# Patient Record
Sex: Male | Born: 1935 | Race: White | Hispanic: No | Marital: Married | Smoking: Never smoker
Health system: Southern US, Community
[De-identification: ages and names within clinical notes are randomized; demographics above are authoritative.]

## PROBLEM LIST (undated history)

## (undated) DIAGNOSIS — Z803 Family history of malignant neoplasm of breast: Secondary | ICD-10-CM

## (undated) DIAGNOSIS — Z8669 Personal history of other diseases of the nervous system and sense organs: Secondary | ICD-10-CM

## (undated) DIAGNOSIS — K635 Polyp of colon: Secondary | ICD-10-CM

## (undated) DIAGNOSIS — I10 Essential (primary) hypertension: Secondary | ICD-10-CM

## (undated) DIAGNOSIS — R0600 Dyspnea, unspecified: Secondary | ICD-10-CM

## (undated) DIAGNOSIS — Z800 Family history of malignant neoplasm of digestive organs: Secondary | ICD-10-CM

## (undated) DIAGNOSIS — E7800 Pure hypercholesterolemia, unspecified: Secondary | ICD-10-CM

## (undated) DIAGNOSIS — D649 Anemia, unspecified: Secondary | ICD-10-CM

## (undated) DIAGNOSIS — Z86010 Personal history of colonic polyps: Secondary | ICD-10-CM

## (undated) DIAGNOSIS — K5790 Diverticulosis of intestine, part unspecified, without perforation or abscess without bleeding: Secondary | ICD-10-CM

## (undated) DIAGNOSIS — I2510 Atherosclerotic heart disease of native coronary artery without angina pectoris: Secondary | ICD-10-CM

## (undated) DIAGNOSIS — N189 Chronic kidney disease, unspecified: Secondary | ICD-10-CM

## (undated) DIAGNOSIS — C801 Malignant (primary) neoplasm, unspecified: Secondary | ICD-10-CM

## (undated) DIAGNOSIS — C189 Malignant neoplasm of colon, unspecified: Secondary | ICD-10-CM

## (undated) DIAGNOSIS — H353190 Nonexudative age-related macular degeneration, unspecified eye, stage unspecified: Secondary | ICD-10-CM

## (undated) DIAGNOSIS — Z5189 Encounter for other specified aftercare: Secondary | ICD-10-CM

## (undated) HISTORY — DX: Family history of malignant neoplasm of digestive organs: Z80.0

## (undated) HISTORY — DX: Chronic kidney disease, unspecified: N18.9

## (undated) HISTORY — DX: Polyp of colon: K63.5

## (undated) HISTORY — DX: Pure hypercholesterolemia, unspecified: E78.00

## (undated) HISTORY — DX: Diverticulosis of intestine, part unspecified, without perforation or abscess without bleeding: K57.90

## (undated) HISTORY — PX: APPENDECTOMY: SHX54

## (undated) HISTORY — PX: NECK SURGERY: SHX720

## (undated) HISTORY — DX: Atherosclerotic heart disease of native coronary artery without angina pectoris: I25.10

## (undated) HISTORY — DX: Essential (primary) hypertension: I10

## (undated) HISTORY — PX: COLON SURGERY: SHX602

## (undated) HISTORY — PX: TONSILLECTOMY: SUR1361

## (undated) HISTORY — DX: Family history of malignant neoplasm of breast: Z80.3

## (undated) HISTORY — DX: Personal history of colonic polyps: Z86.010

## (undated) HISTORY — PX: CATARACT EXTRACTION, BILATERAL: SHX1313

## (undated) HISTORY — DX: Anemia, unspecified: D64.9

## (undated) HISTORY — PX: UPPER GASTROINTESTINAL ENDOSCOPY: SHX188

## (undated) HISTORY — PX: CHOLECYSTECTOMY: SHX55

## (undated) HISTORY — DX: Encounter for other specified aftercare: Z51.89

---

## 1997-09-02 HISTORY — PX: OTHER SURGICAL HISTORY: SHX169

## 1997-09-02 HISTORY — PX: ANGIOPLASTY: SHX39

## 2004-03-02 DIAGNOSIS — K635 Polyp of colon: Secondary | ICD-10-CM

## 2004-03-02 HISTORY — DX: Polyp of colon: K63.5

## 2007-07-21 HISTORY — PX: COLONOSCOPY: SHX174

## 2009-07-06 HISTORY — PX: SIGMOIDOSCOPY: SUR1295

## 2012-02-07 HISTORY — PX: ESOPHAGOGASTRODUODENOSCOPY: SHX1529

## 2018-07-17 ENCOUNTER — Encounter: Payer: Self-pay | Admitting: Gastroenterology

## 2018-07-28 ENCOUNTER — Ambulatory Visit (INDEPENDENT_AMBULATORY_CARE_PROVIDER_SITE_OTHER): Payer: Medicare Other | Admitting: Gastroenterology

## 2018-07-28 ENCOUNTER — Encounter: Payer: Self-pay | Admitting: Gastroenterology

## 2018-07-28 VITALS — BP 138/62 | HR 60 | Ht 69.0 in | Wt 179.5 lb

## 2018-07-28 DIAGNOSIS — D638 Anemia in other chronic diseases classified elsewhere: Secondary | ICD-10-CM | POA: Diagnosis not present

## 2018-07-28 DIAGNOSIS — R195 Other fecal abnormalities: Secondary | ICD-10-CM | POA: Diagnosis not present

## 2018-07-28 NOTE — Patient Instructions (Signed)
If you are age 82 or older, your body mass index should be between 23-30. Your Body mass index is 26.51 kg/m. If this is out of the aforementioned range listed, please consider follow up with your Primary Care Provider.  If you are age 46 or younger, your body mass index should be between 19-25. Your Body mass index is 26.51 kg/m. If this is out of the aformentioned range listed, please consider follow up with your Primary Care Provider.   You have been scheduled for a colonoscopy. Please follow written instructions given to you at your visit today.  Please pick up your prep supplies at the pharmacy within the next 1-3 days. If you use inhalers (even only as needed), please bring them with you on the day of your procedure. Your physician has requested that you go to www.startemmi.com and enter the access code given to you at your visit today. This web site gives a general overview about your procedure. However, you should still follow specific instructions given to you by our office regarding your preparation for the procedure.  Thank you,  Dr. Jackquline Denmark

## 2018-07-28 NOTE — Progress Notes (Signed)
Chief Complaint: heme positive stools  Referring Provider:  Dr Pearson Grippe      ASSESSMENT AND PLAN;   #1. Heme positive stools. Pt with history of colonic polyps 2005/2008.  #2. Anemia of chronic disease with CRI (Cr 2.0 per pts wife). 10/21 Hb 10.9 11/4 11.5, 2 years ago was 13.  Plan: - Please obtain previous recent blood work from Dr. Adin Hector office. - Proceed with colonoscopy with MiraLAX prep.  I have discussed the risks and benefits.  The risks including risk of perforation requiring laparotomy, bleeding after polypectomy requiring blood transfusions and risks of anesthesia/sedation were discussed.  Rare risks of missing colorectal neoplasms were also discussed. Consent forms were given for review. - Discussed above in detail with the patient and patient's wife.   HPI:    Bradley [name] is a 84 y.o. male  With heme positive stools  Anemia of chronic disease with hemoglobin of 10.9, creatinine 2.0 per patient's wife October 2019.  We do not have the results.  No blood work in care everywhere. No nausea, vomiting, heartburn, regurgitation, odynophagia or dysphagia.  No significant diarrhea or constipation.  There is no melena or hematochezia. No unintentional weight loss. Occasional diarrhea after drinking milk/having cereal with milk.   Past GI history: -Colonoscopy 03/29/2004-polyps, diverticulosis.  Repeat colonoscopy 07/03/2005-adenomatous polyps, repeat colonoscopy 07/21/2007-adenomatous polyps, diverticulosis.  Had sigmoidoscopy 07/06/2009-diverticulosis. -EGD 02/07/2012-mild gastritis. Entire GI work-up was performed in [place] by Dr. Huel Cote.   Past Medical History:  Diagnosis Date  . Anemia   . CAD (coronary artery disease)   . Chronic kidney disease   . Colon polyps 03/2004  . Diverticulosis   . Elevated cholesterol   . High blood pressure     Past Surgical History:  Procedure Laterality Date  . ANGIOPLASTY  1999  . APPENDECTOMY    .  CHOLECYSTECTOMY    . COLONOSCOPY  07/21/2007   adenomatous polyps diverticulosis  . ESOPHAGOGASTRODUODENOSCOPY  02/07/2012   mild gastritis   . SIGMOIDOSCOPY  07/06/2009   diverticulosis    Family History  Problem Relation Age of Onset  . Esophageal cancer Father   . High blood pressure Mother   . Kidney failure Mother   . High blood pressure Sister   . Colon cancer Neg Hx     Social History   Tobacco Use  . Smoking status: Never Smoker  . Smokeless tobacco: Never Used  Substance Use Topics  . Alcohol use: Yes    Comment: wine 2 times a month  . Drug use: Never    Current Outpatient Medications  Medication Sig Dispense Refill  . aspirin EC 81 MG tablet Take by mouth daily.    Marland Kitchen atenolol (TENORMIN) 50 MG tablet Take 25 mg by mouth daily.  1  . atorvastatin (LIPITOR) 40 MG tablet Take by mouth daily.    . Lutein 40 MG CAPS Take by mouth daily.    . Multiple Vitamins-Minerals (PRESERVISION AREDS PO) Take 1 tablet by mouth 2 (two) times daily.     No current facility-administered medications for this visit.     Not on File  Review of Systems:  Constitutional: Denies fever, chills, diaphoresis, appetite change and fatigue.  HEENT: Denies photophobia, eye pain, redness, hearing loss, ear pain, congestion, sore throat, rhinorrhea, sneezing, mouth sores, neck pain, neck stiffness and tinnitus.   Respiratory: Denies SOB, DOE, cough, chest tightness,  and wheezing.   Cardiovascular: Denies chest pain, palpitations and leg swelling.  Genitourinary: Denies  dysuria, urgency, frequency, hematuria, flank pain and difficulty urinating.  Musculoskeletal: Denies myalgias, back pain, joint swelling, arthralgias and gait problem.  Skin: No rash.  Neurological: Denies dizziness, seizures, syncope, weakness, light-headedness, numbness and headaches.  Hematological: Denies adenopathy. Easy bruising, personal or family bleeding history  Psychiatric/Behavioral: No anxiety or depression      Physical Exam:    BP 138/62   Pulse 60   Ht 5\' 9"  (1.753 m)   Wt 179 lb 8 oz (81.4 kg)   BMI 26.51 kg/m  Filed Weights   07/28/18 0833  Weight: 179 lb 8 oz (81.4 kg)   Constitutional:  Well-developed, in no acute distress. Psychiatric: Normal mood and affect. Behavior is normal. HEENT: Pupils normal.  Conjunctivae are normal. No scleral icterus. Neck supple.  Cardiovascular: Normal rate, regular rhythm. No edema Pulmonary/chest: Effort normal and breath sounds normal. No wheezing, rales or rhonchi. Abdominal: Soft, nondistended. Nontender. Bowel sounds active throughout. There are no masses palpable. No hepatomegaly. Rectal:  defered Neurological: Alert and oriented to person place and time. Skin: Skin is warm and dry. No rashes noted.    Carmell Austria, MD 07/28/2018, 8:50 AM  Cc: Dr. Pearson Grippe.

## 2018-08-04 ENCOUNTER — Ambulatory Visit (AMBULATORY_SURGERY_CENTER): Payer: Medicare Other | Admitting: Gastroenterology

## 2018-08-04 ENCOUNTER — Encounter: Payer: Self-pay | Admitting: Gastroenterology

## 2018-08-04 VITALS — BP 157/74 | HR 54 | Temp 97.3°F | Resp 14 | Ht 69.0 in | Wt 179.0 lb

## 2018-08-04 DIAGNOSIS — R195 Other fecal abnormalities: Secondary | ICD-10-CM

## 2018-08-04 DIAGNOSIS — K6389 Other specified diseases of intestine: Secondary | ICD-10-CM

## 2018-08-04 DIAGNOSIS — C180 Malignant neoplasm of cecum: Secondary | ICD-10-CM | POA: Diagnosis present

## 2018-08-04 DIAGNOSIS — D125 Benign neoplasm of sigmoid colon: Secondary | ICD-10-CM

## 2018-08-04 DIAGNOSIS — D123 Benign neoplasm of transverse colon: Secondary | ICD-10-CM

## 2018-08-04 DIAGNOSIS — K635 Polyp of colon: Secondary | ICD-10-CM

## 2018-08-04 MED ORDER — SODIUM CHLORIDE 0.9 % IV SOLN: 500.0000 mL | Freq: Once | INTRAVENOUS | Status: DC

## 2018-08-04 NOTE — Progress Notes (Signed)
All specimens sent [facility].called [facility] at 1445.

## 2018-08-04 NOTE — Progress Notes (Signed)
Called to room to assist during endoscopic procedure.  Patient ID and intended procedure confirmed with present staff. Received instructions for my participation in the procedure from the performing physician.  

## 2018-08-04 NOTE — Progress Notes (Signed)
Report to RN, VSS, adequate respirations noted, no c/o pain or discomfort 

## 2018-08-04 NOTE — Patient Instructions (Signed)
Information on polyps,diverticulosis,& hemorrhoids given to you today  Await for pathology results from biopsies from Cecal mass and on polyps removed   Dr Lyndel Safe will order labs once pathology back if needed    YOU HAD AN ENDOSCOPIC PROCEDURE TODAY AT [facility]:   Refer to the procedure report that was given to you for any specific questions about what was found during the examination.  If the procedure report does not answer your questions, please call your gastroenterologist to clarify.  If you requested that your care partner not be given the details of your procedure findings, then the procedure report has been included in a sealed envelope for you to review at your convenience later.  YOU SHOULD EXPECT: Some feelings of bloating in the abdomen. Passage of more gas than usual.  Walking can help get rid of the air that was put into your GI tract during the procedure and reduce the bloating. If you had a lower endoscopy (such as a colonoscopy or flexible sigmoidoscopy) you may notice spotting of blood in your stool or on the toilet paper. If you underwent a bowel prep for your procedure, you may not have a normal bowel movement for a few days.  Please Note:  You might notice some irritation and congestion in your nose or some drainage.  This is from the oxygen used during your procedure.  There is no need for concern and it should clear up in a day or so.  SYMPTOMS TO REPORT IMMEDIATELY:   Following lower endoscopy (colonoscopy or flexible sigmoidoscopy):  Excessive amounts of blood in the stool  Significant tenderness or worsening of abdominal pains  Swelling of the abdomen that is new, acute  Fever of 100F or higher   Following upper endoscopy (EGD)  Vomiting of blood or coffee ground material  New chest pain or pain under the shoulder blades  Painful or persistently difficult swallowing  New shortness of breath  Fever of 100F or higher  Black, tarry-looking  stools  For urgent or emergent issues, a gastroenterologist can be reached at any hour by calling [phone].   DIET:  We do recommend a small meal at first, but then you may proceed to your regular diet.  Drink plenty of fluids but you should avoid alcoholic beverages for 24 hours.  ACTIVITY:  You should plan to take it easy for the rest of today and you should NOT DRIVE or use heavy machinery until tomorrow (because of the sedation medicines used during the test).    FOLLOW UP: Our staff will call the number listed on your records the next business day following your procedure to check on you and address any questions or concerns that you may have regarding the information given to you following your procedure. If we do not reach you, we will leave a message.  However, if you are feeling well and you are not experiencing any problems, there is no need to return our call.  We will assume that you have returned to your regular daily activities without incident.  If any biopsies were taken you will be contacted by phone or by letter within the next 1-3 weeks.  Please call us at [phone] if you have not heard about the biopsies in 3 weeks.    SIGNATURES/CONFIDENTIALITY: You and/or your care partner have signed paperwork which will be entered into your electronic medical record.  These signatures attest to the fact that that the information above on  your After Visit Summary has been reviewed and is understood.  Full responsibility of the confidentiality of this discharge information lies with you and/or your care-partner.

## 2018-08-05 ENCOUNTER — Telehealth: Payer: Self-pay | Admitting: *Deleted

## 2018-08-05 NOTE — Telephone Encounter (Signed)
  Follow up Call-  Call back number 08/04/2018  Post procedure Call Back phone  # [phone]  Permission to leave phone message Yes  Some recent data might be hidden     Patient questions:  Do you have a fever, pain , or abdominal swelling? No. Pain Score  0 *  Have you tolerated food without any problems? Yes.    Have you been able to return to your normal activities? Yes.    Do you have any questions about your discharge instructions: Diet   No. Medications  No. Follow up visit  No.  Do you have questions or concerns about your Care? No.  Actions: * If pain score is 4 or above: No action needed, pain <4.

## 2018-08-06 ENCOUNTER — Encounter: Payer: Self-pay | Admitting: Gastroenterology

## 2018-08-06 ENCOUNTER — Other Ambulatory Visit: Payer: Self-pay

## 2018-08-06 DIAGNOSIS — C180 Malignant neoplasm of cecum: Secondary | ICD-10-CM

## 2018-08-06 DIAGNOSIS — C801 Malignant (primary) neoplasm, unspecified: Secondary | ICD-10-CM

## 2018-08-06 DIAGNOSIS — R195 Other fecal abnormalities: Secondary | ICD-10-CM

## 2018-08-06 DIAGNOSIS — K6389 Other specified diseases of intestine: Secondary | ICD-10-CM

## 2018-08-06 NOTE — Op Note (Signed)
[facility] Patient Name: Bradley [name] Procedure Date: 08/04/2018 2:08 PM MRN: [id number] Endoscopist: Jackquline Denmark , MD Age: 84 Referring MD:  Date of Birth: [birth year] Gender: Male Account #: [id number] Procedure:                Colonoscopy Indications:              Heme positive stool, Unexplained iron deficiency                            anemia Medicines:                Monitored Anesthesia Care Procedure:                Pre-Anesthesia Assessment:                           - Prior to the procedure, a History and Physical                            was performed, and patient medications and                            allergies were reviewed. The patient's tolerance of                            previous anesthesia was also reviewed. The risks                            and benefits of the procedure and the sedation                            options and risks were discussed with the patient.                            All questions were answered, and informed consent                            was obtained. Prior Anticoagulants: The patient has                            taken no previous anticoagulant or antiplatelet                            agents. ASA Grade Assessment: III - A patient with                            severe systemic disease. After reviewing the risks                            and benefits, the patient was deemed in                            satisfactory condition to undergo the procedure.  After obtaining informed consent, the colonoscope                            was passed under direct vision. Throughout the                            procedure, the patient's blood pressure, pulse, and                            oxygen saturations were monitored continuously. The                            Colonoscope was introduced through the anus and                            advanced to the the cecum, identified by the                             ileocecal valve. The colonoscopy was performed                            without difficulty. The patient tolerated the                            procedure well. The quality of the bowel                            preparation was good. Scope In: 2:11:25 PM Scope Out: 2:39:16 PM Scope Withdrawal Time: 0 hours 21 minutes 56 seconds  Total Procedure Duration: 0 hours 27 minutes 51 seconds  Findings:                 A fungating and ulcerated non-obstructing large                            mass was found in the cecum almost completely                            filling the cecum. The mass was circumferential and                            friable. This was biopsied with a cold forceps for                            histology. Area just distal to the mass was                            tattooed with an injection of 3 mL of [place] ink.                           Four sessile polyps were found in the transverse                            colon. The polyps  were 6 to 8 mm in size. Three of                            the larger polyps were removed with a hot snare,                            smaller with cold snare. Resection and retrieval                            were complete. Estimated blood loss: none.                           A 6 mm polyp was found in the proximal sigmoid                            colon. The polyp was sessile. The polyp was removed                            with a cold snare. Resection and retrieval were                            complete. Estimated blood loss: none.                           Multiple small and large-mouthed diverticula were                            found in the sigmoid colon and proximal descending                            colon.                           Non-bleeding internal hemorrhoids were found during                            retroflexion. The hemorrhoids were small. Complications:            No immediate  complications. Estimated Blood Loss:     Estimated blood loss: none. Impression:               -Cecal mass (Biopsied. Tattooed)                           -Colonic polyps status post polypectomy.                           -Moderate to severe predominantly sigmoid                            diverticulosis. Recommendation:           - Patient has a contact number available for                            emergencies. The signs and symptoms of potential  delayed complications were discussed with the                            patient. Return to normal activities tomorrow.                            Written discharge instructions were provided to the                            patient.                           - Resume previous diet.                           - Continue present medications.                           - Await pathology results.                           - Once the biopsies are back, check CBC, CMP and a                            CEA level. Thereafter, refer to a surgery and                            oncology. Jackquline Denmark, MD 08/04/2018 2:52:09 PM This report has been signed electronically.

## 2018-08-06 NOTE — Progress Notes (Signed)
Called patient's wife-informed lab work has been ordered for [facility] @ [facility] on 08/07/18 arrival at 1:15 pm for appt at 1:30 pm; patient's wife Regino Schultze) verbalized understanding and will call back if questions/concerns arise;  MD please advise -[facility] as the referral location?

## 2018-08-06 NOTE — Progress Notes (Signed)
Have discussed the biopsy results with the patient's wife over the phone. Her phone number is [phone].  Plan: -Check CBC, CMP, CEA level.  Patient's wife would like to get it done tomorrow afternoon at [facility]  -Needs CT scan of the abdomen and pelvis with p.o. and preferably IV contrast. (Of note the previous creatinine was elevated.  We may have to run it by Dr. Joelyn Oms nephrology once the blood work is back) -Needs surgical consultation for possible right hemicolectomy and oncology consultation. -Please call patient's wife at the above phone number.

## 2018-08-07 ENCOUNTER — Other Ambulatory Visit (INDEPENDENT_AMBULATORY_CARE_PROVIDER_SITE_OTHER): Payer: Medicare Other

## 2018-08-07 DIAGNOSIS — C180 Malignant neoplasm of cecum: Secondary | ICD-10-CM | POA: Diagnosis not present

## 2018-08-07 DIAGNOSIS — R195 Other fecal abnormalities: Secondary | ICD-10-CM

## 2018-08-07 DIAGNOSIS — K6389 Other specified diseases of intestine: Secondary | ICD-10-CM

## 2018-08-07 DIAGNOSIS — C801 Malignant (primary) neoplasm, unspecified: Secondary | ICD-10-CM

## 2018-08-07 LAB — CBC WITH DIFFERENTIAL/PLATELET
Basophils Absolute: 0 10*3/uL (ref 0.0–0.1)
Basophils Relative: 0.6 % (ref 0.0–3.0)
Eosinophils Absolute: 0.3 10*3/uL (ref 0.0–0.7)
Eosinophils Relative: 4.7 % (ref 0.0–5.0)
HCT: 35.4 % — ABNORMAL LOW (ref 39.0–52.0)
HEMOGLOBIN: 11.3 g/dL — AB (ref 13.0–17.0)
Lymphocytes Relative: 21.3 % (ref 12.0–46.0)
Lymphs Abs: 1.4 10*3/uL (ref 0.7–4.0)
MCHC: 32 g/dL (ref 30.0–36.0)
MCV: 82.3 fl (ref 78.0–100.0)
Monocytes Absolute: 0.5 10*3/uL (ref 0.1–1.0)
Monocytes Relative: 7.8 % (ref 3.0–12.0)
Neutro Abs: 4.2 10*3/uL (ref 1.4–7.7)
Neutrophils Relative %: 65.6 % (ref 43.0–77.0)
Platelets: 211 10*3/uL (ref 150.0–400.0)
RBC: 4.3 Mil/uL (ref 4.22–5.81)
RDW: 16.5 % — ABNORMAL HIGH (ref 11.5–15.5)
WBC: 6.4 10*3/uL (ref 4.0–10.5)

## 2018-08-07 LAB — COMPREHENSIVE METABOLIC PANEL
ALT: 9 U/L (ref 0–53)
AST: 15 U/L (ref 0–37)
Albumin: 3.8 g/dL (ref 3.5–5.2)
Alkaline Phosphatase: 64 U/L (ref 39–117)
BILIRUBIN TOTAL: 0.4 mg/dL (ref 0.2–1.2)
BUN: 33 mg/dL — ABNORMAL HIGH (ref 6–23)
CO2: 25 mEq/L (ref 19–32)
Calcium: 8.5 mg/dL (ref 8.4–10.5)
Chloride: 108 mEq/L (ref 96–112)
Creatinine, Ser: 2.11 mg/dL — ABNORMAL HIGH (ref 0.40–1.50)
GFR: 31.89 mL/min — ABNORMAL LOW (ref >60.00)
Glucose, Bld: 163 mg/dL — ABNORMAL HIGH (ref 70–99)
Potassium: 4.4 mEq/L (ref 3.5–5.1)
Sodium: 141 mEq/L (ref 135–145)
Total Protein: 6 g/dL (ref 6.0–8.3)

## 2018-08-07 NOTE — Progress Notes (Signed)
Lets do [facility] as surgical ref

## 2018-08-07 NOTE — Progress Notes (Signed)
Surgical referral faxed to [facility] for possible right hemicolectomy-patient is scheduled on 08/31/18 at 11:30 am; amb ref to oncology order placed;  Is the next step in the plan of care for this patient a CT abd/pelvis with ORAL contrast only (Dr. Joelyn Oms?) or please advise on next step for this patient's plan of care; Thank you

## 2018-08-10 LAB — CEA: CEA: 2.3 ng/mL

## 2018-08-11 ENCOUNTER — Other Ambulatory Visit: Payer: Self-pay

## 2018-08-11 DIAGNOSIS — K6389 Other specified diseases of intestine: Secondary | ICD-10-CM

## 2018-08-11 DIAGNOSIS — R195 Other fecal abnormalities: Secondary | ICD-10-CM

## 2018-08-11 NOTE — Progress Notes (Signed)
CT abd/pelvis ordered for patient;

## 2018-08-11 NOTE — Progress Notes (Signed)
Attempted to reach patient -unable to leave message-will try to reach patient at a later date/time;

## 2018-08-11 NOTE — Progress Notes (Signed)
Lets get CT scan abdo/pelvis with PO (no IV) contrast. Set him with Oncology co-ordinator

## 2018-08-12 NOTE — Progress Notes (Signed)
Please see additional documentation concerning CT abd/pelvis already being ordered for the patient on 08/14/18 with [place] CT-arrival at 4:00pm, 1st bottle of contrast at 2:15 pm and the 2nd bottle at 3:15 pm; patient aware to obtain contrast from [place] CT and also obtain the instructions for scan; patient's wife verbalized understanding of information/instructions; patient's wife also advised to call back if questions/concerns arise;

## 2018-08-13 ENCOUNTER — Ambulatory Visit: Payer: Self-pay | Admitting: Gastroenterology

## 2018-08-14 ENCOUNTER — Ambulatory Visit (INDEPENDENT_AMBULATORY_CARE_PROVIDER_SITE_OTHER)
Admission: RE | Admit: 2018-08-14 | Discharge: 2018-08-14 | Disposition: A | Discharge status: Home / Self Care | Payer: Medicare Other | Source: Ambulatory Visit | Attending: Gastroenterology | Admitting: Gastroenterology

## 2018-08-14 DIAGNOSIS — K6389 Other specified diseases of intestine: Secondary | ICD-10-CM | POA: Diagnosis not present

## 2018-08-14 DIAGNOSIS — R195 Other fecal abnormalities: Secondary | ICD-10-CM

## 2018-08-18 ENCOUNTER — Telehealth: Payer: Self-pay

## 2018-08-18 NOTE — Telephone Encounter (Signed)
Called patient and left VM requesting that they return my call to discuss next steps.

## 2018-09-01 ENCOUNTER — Other Ambulatory Visit: Payer: Self-pay | Admitting: Surgery

## 2018-09-01 DIAGNOSIS — C189 Malignant neoplasm of colon, unspecified: Secondary | ICD-10-CM

## 2018-09-03 ENCOUNTER — Ambulatory Visit
Admission: RE | Admit: 2018-09-03 | Discharge: 2018-09-03 | Disposition: A | Discharge status: Home / Self Care | Payer: Medicare Other | Source: Ambulatory Visit | Attending: Surgery | Admitting: Surgery

## 2018-09-03 DIAGNOSIS — C189 Malignant neoplasm of colon, unspecified: Secondary | ICD-10-CM

## 2018-09-17 ENCOUNTER — Ambulatory Visit: Payer: Self-pay | Admitting: Surgery

## 2018-09-17 NOTE — H&P (Signed)
CC: Referred by Dr. 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 Selbe is a very pleasant 62yoM with hx of HTN, HLD, CKD, CAD who underwent a colonoscopy on 08/04/18 with Dr. Lyndel Safe. This was done for heme positive stool. He reports a two-year history of anemia with hemoglobin typically hovering around 10-11. He denies ever having noticed frank blood in his stool or discolored stool. He denies any changes in weight. He denies any abdominal pain. He denies any nausea/vomiting. His colonoscopy was completed 08/04/18 which demonstrated: 1. Fungating mass in the cecum-biopsies returned invasive adenocarcinoma. This was tattooed distal to the lesion. 2. 4 polyps in the transverse colon which were removed-tubular adenomas 3. Polyp in the sigmoid colon which was removed-tubular adenoma  He subsequently underwent CT abdomen and pelvis without contrast due to underlying CKD and this demonstrated a mass in the cecum as well as a questionable area of thickening in the hepatic flexure. There is no evidence of metastatic disease in the abdomen/pelvis. He has not had a CT scan of his chest. His CEA was 2.3. Last hemoglobin was 11.3  He reports having had a colonoscopy approximately 8 years prior and has a personal history of polyps. He denies any personal or family history of colorectal cancer.  PMH: HTN, HLD, CKD, CAD - remote cardiac event - 1999 - underwent cardiac catheter and angioplasty but denies having had a stent placed. This was done by Dr. Kandis Fantasia in [place]. He is currently under the care of Dr. Dede Query who is his primary cardiologist. He states he is planning to switch back to Dr. Kandis Fantasia in near future. He reports having seen Dr. Rosana Hoes him back in November of this year  PSH: Laparoscopic cholecystectomy (Dr. Margot Chimes); open appendectomy in the 1960s-Dr. Vida Rigger  FHx: Denies FHx of malignancy.  Social: Denies use of tobacco/drugs; 2-3 glasses wine per  month. 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 Former patient of Dr. Osborn Coho; has worked with Dr. Leafy Kindle & Vida Rigger as well.  ROS: A comprehensive 10 system review of systems was completed with the patient and pertinent findings as noted above.  The patient is a 85 year old male.   Past Surgical History Alean Rinne, [place]; 08/31/2018 11:23 AM) Appendectomy  Cataract Surgery  Bilateral. Colon Polyp Removal - Colonoscopy  Gallbladder Surgery - Laparoscopic  Tonsillectomy   Diagnostic Studies History Alean Rinne, [place]; 08/31/2018 11:23 AM) Colonoscopy  within last year  Allergies Alean Rinne, RMA; 08/31/2018 11:25 AM) No Known Drug Allergies [08/31/2018]: Allergies Reconciled   Medication History Alean Rinne, [place]; 08/31/2018 11:26 AM) Lipitor (20MG  Tablet, Oral) Active. Lutein (40MG  Capsule, Oral) Active. Multi-Day (Oral) Active. Atenolol (25MG  Tablet, Oral) Active. Medications Reconciled  Social History Alean Rinne, [place]; 08/31/2018 11:23 AM) Alcohol use  Occasional alcohol use. Caffeine use  Carbonated beverages, Coffee, Tea. No drug use  Tobacco use  Never smoker.  Family History Alean Rinne, [place]; 08/31/2018 11:23 AM) Breast Cancer  Sister. Cancer  Father. Kidney Disease  Mother. Migraine Headache  Son. Thyroid problems  Son.  Other Problems Alean Rinne, [place]; 08/31/2018 11:23 AM) Diverticulosis  Enlarged Prostate  Melanoma  Migraine Headache  Other disease, cancer, significant illness  Transfusion history     Review of Systems Alean Rinne RMA; 08/31/2018 11:23 AM) General Not Present- Appetite Loss, Chills, Fatigue, Fever, Night Sweats, Weight Gain and Weight Loss. Skin Not Present- Change in Wart/Mole, Dryness, Hives, Jaundice, New Lesions, Non-Healing Wounds, Rash and Ulcer. HEENT  Present- Hearing Loss. Not Present- Earache, Hoarseness, Nose Bleed, Oral Ulcers, Ringing in  the Ears, Seasonal Allergies, Sinus Pain, Sore Throat, Visual Disturbances, Wears glasses/contact lenses and Yellow Eyes. Respiratory Not Present- Bloody sputum, Chronic Cough, Difficulty Breathing, Snoring and Wheezing. Breast Not Present- Breast Mass, Breast Pain, Nipple Discharge and Skin Changes. Cardiovascular Not Present- Chest Pain, Difficulty Breathing Lying Down, Leg Cramps, Palpitations, Rapid Heart Rate, Shortness of Breath and Swelling of Extremities. Gastrointestinal Not Present- Abdominal Pain, Bloating, Bloody Stool, Change in Bowel Habits, Chronic diarrhea, Constipation, Difficulty Swallowing, Excessive gas, Gets full quickly at meals, Hemorrhoids, Indigestion, Nausea, Rectal Pain and Vomiting. Male Genitourinary Present- Frequency and Urgency. Not Present- Blood in Urine, Change in Urinary Stream, Impotence, Nocturia, Painful Urination and Urine Leakage. Musculoskeletal Present- Joint Stiffness. Not Present- Back Pain, Joint Pain, Muscle Pain, Muscle Weakness and Swelling of Extremities. Neurological Not Present- Decreased Memory, Fainting, Headaches, Numbness, Seizures, Tingling, Tremor, Trouble walking and Weakness. Psychiatric Not Present- Anxiety, Bipolar, Change in Sleep Pattern, Depression, Fearful and Frequent crying. Endocrine Not Present- Cold Intolerance, Excessive Hunger, Hair Changes, Heat Intolerance, Hot flashes and New Diabetes.  Vitals Alean Rinne RMA; 08/31/2018 11:25 AM) 08/31/2018 11:24 AM Weight: 178.8 lb Height: 69in Body Surface Area: 1.97 m Body Mass Index: 26.4 kg/m  Temp.: 97.62F  Pulse: 97 (Regular)  BP: 146/62 (Sitting, Left Arm, Standard)       Physical Exam Harrell Gave M. Raynette Arras MD; 08/31/2018 11:54 AM) The physical exam findings are as follows: Note:Constitutional: No acute distress; conversant; no deformities Eyes: Moist conjunctiva; no lid lag; anicteric sclerae; pupils equal round and reactive to light Neck: Trachea  midline; no palpable thyromegaly Lungs: Normal respiratory effort; no tactile fremitus CV: Regular rate and rhythm; no palpable thrill; no pitting edema GI: Abdomen soft, nontender, nondistended; no palpable hepatosplenomegaly; scars consistent with stated surgical history MSK: Normal gait; no clubbing/cyanosis Psychiatric: Appropriate affect; alert and oriented 3 Lymphatic: No palpable cervical or axillary lymphadenopathy    Assessment & Plan Harrell Gave M. Meiah Zamudio MD; 08/31/2018 11:57 AM) COLON CANCER (C18.9) Story: Mr. Pullara is a very pleasant 42yoM with hx of HTN, HLD, CKD, CAD here today with newly diagnosed cancer of the cecum. CT chest has not been obtained CT abdomen and pelvis without contrast demonstrated no evidence of metastatic disease in the abdomen or pelvis CEA 2.3 Impression: -The anatomy and physiology of the GI tract was discussed at length with the patient. The pathophysiology of colon cancer was discussed at length with associated pictures. The treatment approach to colon cancer was discussed as well as importance of surveillance -We discussed that he will need to obtain a CT chest (without contrast) prior to surgery to ensure no metastatic disease present in the chest -We will request cardiac clearance from his cardiologist, Dr. Dede Query -Pending completion of the above, we discussed options for treatment moving forward. Assuming no metastatic disease, we discussed surgery-laparoscopic and possible open techniques. We discussed right hemicolectomy. -The planned procedure, material risks (including, but not limited to, pain, bleeding, infection, scarring, need for blood transfusion, damage to surrounding structures- blood vessels/nerves/viscus/organs, damage to ureter, urine leak, leak from anastomosis, need for additional procedures, need for stoma which may be permanent, worsening of pre-existing medical conditions including possible need for dialysis, hernia, recurrence  of cancer, pneumonia, heart attack, stroke, death) benefits and alternatives to surgery were discussed at length. The patient's questions and his wife's were answered to their satisfaction, they voiced understanding and elected to proceed with surgery. Additionally, we discussed typical  postoperative expectations and the recovery process.  Signed electronically by Ileana Roup, MD (08/31/2018 11:58 AM)

## 2018-09-29 ENCOUNTER — Encounter (HOSPITAL_COMMUNITY)
Admission: RE | Admit: 2018-09-29 | Discharge: 2018-09-29 | Disposition: A | Discharge status: Home / Self Care | Payer: Medicare Other | Source: Ambulatory Visit | Attending: Surgery | Admitting: Surgery

## 2018-09-29 ENCOUNTER — Other Ambulatory Visit: Payer: Self-pay

## 2018-09-29 ENCOUNTER — Encounter (HOSPITAL_COMMUNITY): Payer: Self-pay

## 2018-09-29 DIAGNOSIS — C189 Malignant neoplasm of colon, unspecified: Secondary | ICD-10-CM | POA: Insufficient documentation

## 2018-09-29 DIAGNOSIS — Z01812 Encounter for preprocedural laboratory examination: Secondary | ICD-10-CM | POA: Insufficient documentation

## 2018-09-29 HISTORY — DX: Malignant neoplasm of colon, unspecified: C18.9

## 2018-09-29 HISTORY — DX: Malignant (primary) neoplasm, unspecified: C80.1

## 2018-09-29 HISTORY — DX: Dyspnea, unspecified: R06.00

## 2018-09-29 HISTORY — DX: Nonexudative age-related macular degeneration, unspecified eye, stage unspecified: H35.3190

## 2018-09-29 LAB — CBC WITH DIFFERENTIAL/PLATELET
Abs Immature Granulocytes: 0.03 10*3/uL (ref 0.00–0.07)
Basophils Absolute: 0 10*3/uL (ref 0.0–0.1)
Basophils Relative: 0 %
EOS ABS: 0.2 10*3/uL (ref 0.0–0.5)
Eosinophils Relative: 3 %
HCT: 35.9 % — ABNORMAL LOW (ref 39.0–52.0)
Hemoglobin: 10.7 g/dL — ABNORMAL LOW (ref 13.0–17.0)
Immature Granulocytes: 0 %
Lymphocytes Relative: 22 %
Lymphs Abs: 1.5 10*3/uL (ref 0.7–4.0)
MCH: 26.4 pg (ref 26.0–34.0)
MCHC: 29.8 g/dL — ABNORMAL LOW (ref 30.0–36.0)
MCV: 88.6 fL (ref 80.0–100.0)
Monocytes Absolute: 0.7 10*3/uL (ref 0.1–1.0)
Monocytes Relative: 11 %
NEUTROS PCT: 64 %
Neutro Abs: 4.4 10*3/uL (ref 1.7–7.7)
Platelets: 212 10*3/uL (ref 150–400)
RBC: 4.05 MIL/uL — ABNORMAL LOW (ref 4.22–5.81)
RDW: 15.4 % (ref 11.5–15.5)
WBC: 6.9 10*3/uL (ref 4.0–10.5)
nRBC: 0 % (ref 0.0–0.2)

## 2018-09-29 LAB — COMPREHENSIVE METABOLIC PANEL
ALT: 15 U/L (ref 0–44)
AST: 16 U/L (ref 15–41)
Albumin: 3.8 g/dL (ref 3.5–5.0)
Alkaline Phosphatase: 67 U/L (ref 38–126)
Anion gap: 8 (ref 5–15)
BILIRUBIN TOTAL: 0.7 mg/dL (ref 0.3–1.2)
BUN: 35 mg/dL — ABNORMAL HIGH (ref 8–23)
CO2: 24 mmol/L (ref 22–32)
Calcium: 8.6 mg/dL — ABNORMAL LOW (ref 8.9–10.3)
Chloride: 108 mmol/L (ref 98–111)
Creatinine, Ser: 2.25 mg/dL — ABNORMAL HIGH (ref 0.61–1.24)
GFR calc Af Amer: 30 mL/min — ABNORMAL LOW (ref >60)
GFR calc non Af Amer: 26 mL/min — ABNORMAL LOW (ref >60)
Glucose, Bld: 105 mg/dL — ABNORMAL HIGH (ref 70–99)
Potassium: 5 mmol/L (ref 3.5–5.1)
Sodium: 140 mmol/L (ref 135–145)
Total Protein: 6.4 g/dL — ABNORMAL LOW (ref 6.5–8.1)

## 2018-09-29 LAB — HEMOGLOBIN A1C
Hgb A1c MFr Bld: 6.6 % — ABNORMAL HIGH (ref 4.8–5.6)
Mean Plasma Glucose: 142.72 mg/dL

## 2018-09-29 LAB — APTT: APTT: 33 s (ref 24–36)

## 2018-09-29 LAB — PROTIME-INR
INR: 0.9
Prothrombin Time: 12.1 seconds (ref 11.4–15.2)

## 2018-09-29 LAB — ABO/RH: ABO/RH(D): A POS

## 2018-09-29 NOTE — Progress Notes (Signed)
EKG 07-15-18 ON CHART FROM [facility]

## 2018-09-29 NOTE — Patient Instructions (Addendum)
Bradley [name]  09/29/2018   Your procedure is scheduled on: 10-08-2018    Report to [facility] Main  Entrance     Report to admitting at 5:30AM    Call this number if you have problems the morning of surgery [phone]      Remember: DRINK 2 [place] AT  10:00 PM . NO SOLIDS AFTER MIDNIGHT THE DAY PRIOR TO THE SURGERY. NOTHING BY MOUTH EXCEPT CLEAR LIQUIDS. STOP CLEAR LIQUIDS AT 4:30AM. DRINK 1 PRESURGERY  THE DAY OF THE PROCEDURE 3 HOURS PRIOR TO SCHEDULED SURGERY AT 4:30AM.  BRUSH YOUR TEETH MORNING OF SURGERY AND RINSE YOUR MOUTH OUT, NO CHEWING GUM CANDY OR MINTS.        CLEAR LIQUID DIET   Foods Allowed                                                                     Foods Excluded  Coffee and tea, regular and decaf                             liquids that you cannot  Plain Jell-O in any flavor                                             see through such as: Fruit ices (not with fruit pulp)                                     milk, soups, orange juice  Iced Popsicles                                    All solid food Carbonated beverages, regular and diet                                    Cranberry, grape and apple juices Sports drinks like Gatorade Lightly seasoned clear broth or consume(fat free) Sugar, honey syrup  Sample Menu Breakfast                                Lunch                                     Supper Cranberry juice                    Beef broth                            Chicken broth Jell-O  Grape juice                           Apple juice Coffee or tea                        Jell-O                                      Popsicle                                                Coffee or tea                        Coffee or tea  _____________________________________________________________________     Take these medicines the morning of  surgery with A SIP OF WATER: Atenolol, Atorvastatin                                You may not have any metal on your body including hair pins and              piercings  Do not wear jewelry, make-up, lotions, powders or perfumes, deodorant             Do not wear nail polish.  Do not shave  48 hours prior to surgery.              Men may shave face and neck.   Do not bring valuables to the hospital. [place].  Contacts, dentures or bridgework may not be worn into surgery.  Leave suitcase in the car. After surgery it may be brought to your room.                  Please read over the following fact sheets you were given: _____________________________________________________________________             [facility] - Preparing for Surgery Before surgery, you can play an important role.  Because skin is not sterile, your skin needs to be as free of germs as possible.  You can reduce the number of germs on your skin by washing with CHG (chlorahexidine gluconate) soap before surgery.  CHG is an antiseptic cleaner which kills germs and bonds with the skin to continue killing germs even after washing. Please DO NOT use if you have an allergy to CHG or antibacterial soaps.  If your skin becomes reddened/irritated stop using the CHG and inform your nurse when you arrive at Short Stay. Do not shave (including legs and underarms) for at least 48 hours prior to the first CHG shower.  You may shave your face/neck. Please follow these instructions carefully:  1.  Shower with CHG Soap the night before surgery and the  morning of Surgery.  2.  If you choose to wash your hair, wash your hair first as usual with your  normal  shampoo.  3.  After you shampoo, rinse your hair and body thoroughly to remove the  shampoo.  4.  Use CHG as you would any other liquid soap.  You can apply chg directly  to the skin and wash                        Gently with a scrungie or clean washcloth.  5.  Apply the CHG Soap to your body ONLY FROM THE NECK DOWN.   Do not use on face/ open                           Wound or open sores. Avoid contact with eyes, ears mouth and genitals (private parts).                       Wash face,  Genitals (private parts) with your normal soap.             6.  Wash thoroughly, paying special attention to the area where your surgery  will be performed.  7.  Thoroughly rinse your body with warm water from the neck down.  8.  DO NOT shower/wash with your normal soap after using and rinsing off  the CHG Soap.                9.  Pat yourself dry with a clean towel.            10.  Wear clean pajamas.            11.  Place clean sheets on your bed the night of your first shower and do not  sleep with pets. Day of Surgery : Do not apply any lotions/deodorants the morning of surgery.  Please wear clean clothes to the hospital/surgery center.  FAILURE TO FOLLOW THESE INSTRUCTIONS MAY RESULT IN THE CANCELLATION OF YOUR SURGERY PATIENT SIGNATURE_________________________________  NURSE SIGNATURE__________________________________  ________________________________________________________________________   [name]  An incentive spirometer is a tool that can help keep your lungs clear and active. This tool measures how well you are filling your lungs with each breath. Taking long deep breaths may help reverse or decrease the chance of developing breathing (pulmonary) problems (especially infection) following:  A long period of time when you are unable to move or be active. BEFORE THE PROCEDURE   If the spirometer includes an indicator to show your best effort, your nurse or respiratory therapist will set it to a desired goal.  If possible, sit up straight or lean slightly forward. Try not to slouch.  Hold the incentive spirometer in an upright position. INSTRUCTIONS FOR USE  1. Sit on the edge of your  bed if possible, or sit up as far as you can in bed or on a chair. 2. Hold the incentive spirometer in an upright position. 3. Breathe out normally. 4. Place the mouthpiece in your mouth and seal your lips tightly around it. 5. Breathe in slowly and as deeply as possible, raising the piston or the ball toward the top of the column. 6. Hold your breath for 3-5 seconds or for as long as possible. Allow the piston or ball to fall to the bottom of the column. 7. Remove the mouthpiece from your mouth and breathe out normally. 8. Rest for a few seconds and repeat Steps 1 through 7 at least 10 times every 1-2 hours when you are awake. Take your time and take a few normal breaths between deep breaths. 9. The spirometer may include an indicator to  show your best effort. Use the indicator as a goal to work toward during each repetition. 10. After each set of 10 deep breaths, practice coughing to be sure your lungs are clear. If you have an incision (the cut made at the time of surgery), support your incision when coughing by placing a pillow or rolled up towels firmly against it. Once you are able to get out of bed, walk around indoors and cough well. You may stop using the incentive spirometer when instructed by your caregiver.  RISKS AND COMPLICATIONS  Take your time so you do not get dizzy or light-headed.  If you are in pain, you may need to take or ask for pain medication before doing incentive spirometry. It is harder to take a deep breath if you are having pain. AFTER USE  Rest and breathe slowly and easily.  It can be helpful to keep track of a log of your progress. Your caregiver can provide you with a simple table to help with this. If you are using the spirometer at home, follow these instructions: [place] IF:   You are having difficultly using the spirometer.  You have trouble using the spirometer as often as instructed.  Your pain medication is not giving enough relief while  using the spirometer.  You develop fever of 100.5 F (38.1 C) or higher. SEEK IMMEDIATE MEDICAL CARE IF:   You cough up bloody sputum that had not been present before.  You develop fever of 102 F (38.9 C) or greater.  You develop worsening pain at or near the incision site. MAKE SURE YOU:   Understand these instructions.  Will watch your condition.  Will get help right away if you are not doing well or get worse. Document Released: 12/30/2006 Document Revised: 11/11/2011 Document Reviewed: 03/02/2007 [facility] Patient Information 2014 [place], [place].   ________________________________________________________________________

## 2018-09-30 NOTE — Progress Notes (Signed)
Anesthesia Chart Review   Case:  [id number] Date/Time:  10/08/18 0715   Procedure:  LAPAROSCOPIC VS OPEN RIGHT HEMI COLECTOMY ERAS PATHWAY (Right )   Anesthesia type:  General   Pre-op diagnosis:  cecal colon cancer   Location:  [facility] ROOM 02 / [facility]   Surgeon:  Ileana Roup, MD      DISCUSSION: 85 yo never smoker with h/o CKD Stage III, CAD, anemia (hemoglobin 10.7), cecal colon cancer scheduled for above surgery 10/08/2018 with Dr. Nadeen Landau.    CKD Stage III with creatinine at PST visit 2.25, followed by nephrologist Dr. Pearson Grippe.  Creatinine 2.04 Apr 08, 1931.    Cardiology clearance received 09/03/2018.  Per Dr. Dede Query, "If able to ADL's w/o CP or limiting SOB, then ok to proceed.  If necessary to stop asa stop for as little time as possible and start back ASAP pot op.  Pt states he currently has no CP or shortness of breath performing ADL's and will hold ASA only if instructed to do so by surgeon; pt will resume ASA 81mg  ASAP after surgery."  Pt can proceed with planned procedure barring acute status change.  VS: BP (!) 147/61   Pulse (!) 58   Temp 36.8 C (Oral)   Resp 16   SpO2 100%   PROVIDERS: Jackquline Berlin, DO is PCP   Dede Query, MD is Cardiologist last seen 07/2018  Pearson Grippe, MD is nephrologist  LABS: Labs reviewed: Acceptable for surgery. (all labs ordered are listed, but only abnormal results are displayed)  Labs Reviewed  CBC WITH DIFFERENTIAL/PLATELET - Abnormal; Notable for the following components:      Result Value   RBC 4.05 (*)    Hemoglobin 10.7 (*)    HCT 35.9 (*)    MCHC 29.8 (*)    All other components within normal limits  COMPREHENSIVE METABOLIC PANEL - Abnormal; Notable for the following components:   Glucose, Bld 105 (*)    BUN 35 (*)    Creatinine, Ser 2.25 (*)    Calcium 8.6 (*)    Total Protein 6.4 (*)    GFR calc non Af Amer 26 (*)    GFR calc Af Amer 30 (*)    All other components within normal limits   HEMOGLOBIN A1C - Abnormal; Notable for the following components:   Hgb A1c MFr Bld 6.6 (*)    All other components within normal limits  APTT  PROTIME-INR  TYPE AND SCREEN  ABO/RH     IMAGES: CT Chest 09/03/2018 IMPRESSION: 1. No evidence of thoracic metastatic disease or acute chest process. 2. Small left upper lobe granulomas. 3. Coronary and Aortic Atherosclerosis (ICD10-I70.0). Stable small pericardial effusion.  EKG: 07/15/18 Rate 53 bpm Sinus bradycardia Within normal limits   CV:  Past Medical History:  Diagnosis Date  . Anemia    HEME OCCULT POSITIVE   . CAD (coronary artery disease)    THEY FOUND A BLOCKAGE IN 1999 DURNG A CATHERIZATION , NOT ENOUGH TO  NEED A STENT , THEY JUST CLEANED ME OUT   . Cancer [facility])    SKIN CANCER   . Chronic kidney disease   . Colon cancer ([place])   . Colon polyps 03/2004  . Diverticulosis   . 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 Dyspnea    ON EXERTION   . Elevated cholesterol   . High blood pressure     Past Surgical History:  Procedure Laterality Date  . ANGIOPLASTY  1999  . APPENDECTOMY    . CATARACT EXTRACTION, BILATERAL    . CATHERIZATION   1999  . CHOLECYSTECTOMY    . COLONOSCOPY  07/21/2007   adenomatous polyps diverticulosis  . ESOPHAGOGASTRODUODENOSCOPY  02/07/2012   mild gastritis   . SIGMOIDOSCOPY  07/06/2009   diverticulosis    MEDICATIONS: . aspirin EC 81 MG tablet  . atenolol (TENORMIN) 25 MG tablet  . atorvastatin (LIPITOR) 40 MG tablet  . Lutein 40 MG CAPS  . Multiple Vitamins-Minerals (PRESERVISION AREDS PO)   No current facility-administered medications for this encounter.      Maia Plan [facility] Pre-Surgical Testing  [phone] 10/06/18 10:29 AM

## 2018-10-06 NOTE — Anesthesia Preprocedure Evaluation (Addendum)
Anesthesia Evaluation  Patient identified by MRN, date of birth, ID band Patient awake    Reviewed: Allergy & Precautions, H&P , NPO status , Patient's Chart, lab work & pertinent test results, reviewed documented beta blocker date and time   Airway Mallampati: III  TM Distance: >3 FB Neck ROM: Full    Dental no notable dental hx. (+) Teeth Intact, Dental Advisory Given   Pulmonary neg pulmonary ROS,    Pulmonary exam normal breath sounds clear to auscultation       Cardiovascular Exercise Tolerance: Good hypertension, Pt. on medications and Pt. on home beta blockers + CAD   Rhythm:Regular Rate:Normal     Neuro/Psych negative neurological ROS  negative psych ROS   GI/Hepatic negative GI ROS, Neg liver ROS,   Endo/Other  negative endocrine ROS  Renal/GU Renal InsufficiencyRenal disease  negative genitourinary   Musculoskeletal   Abdominal   Peds  Hematology  (+) Blood dyscrasia, anemia ,   Anesthesia Other Findings   Reproductive/Obstetrics negative OB ROS                           Anesthesia Physical Anesthesia Plan  ASA: III  Anesthesia Plan: General   Post-op Pain Management:    Induction: Intravenous  PONV Risk Score and Plan: 3 and Ondansetron, Dexamethasone and Treatment may vary due to age or medical condition  Airway Management Planned: Oral ETT  Additional Equipment:   Intra-op Plan:   Post-operative Plan: Extubation in OR  Informed Consent: I have reviewed the patients History and Physical, chart, labs and discussed the procedure including the risks, benefits and alternatives for the proposed anesthesia with the patient or authorized representative who has indicated his/her understanding and acceptance.     Dental advisory given  Plan Discussed with: CRNA  Anesthesia Plan Comments: (See PST note 09/29/18, Konrad Felix, PA-C)       Anesthesia Quick  Evaluation

## 2018-10-07 MED ORDER — BUPIVACAINE LIPOSOME 1.3 % IJ SUSP
20.0000 mL | INTRAMUSCULAR | Status: DC
  Filled 2018-10-07: qty 20

## 2018-10-08 ENCOUNTER — Encounter (HOSPITAL_COMMUNITY)
Admission: RE | Disposition: A | Discharge status: Home / Self Care | Payer: Self-pay | Source: Home / Self Care | Attending: Surgery

## 2018-10-08 ENCOUNTER — Encounter (HOSPITAL_COMMUNITY): Payer: Self-pay | Admitting: *Deleted

## 2018-10-08 ENCOUNTER — Inpatient Hospital Stay (HOSPITAL_COMMUNITY): Payer: Medicare Other | Admitting: Physician Assistant

## 2018-10-08 ENCOUNTER — Other Ambulatory Visit: Payer: Self-pay

## 2018-10-08 ENCOUNTER — Inpatient Hospital Stay (HOSPITAL_COMMUNITY): Payer: Medicare Other | Admitting: Anesthesiology

## 2018-10-08 ENCOUNTER — Inpatient Hospital Stay (HOSPITAL_COMMUNITY)
Admission: RE | Admit: 2018-10-08 | Discharge: 2018-10-14 | DRG: 330 | Disposition: A | Discharge status: Home / Self Care | Payer: Medicare Other | Attending: Surgery | Admitting: Surgery

## 2018-10-08 DIAGNOSIS — Z85828 Personal history of other malignant neoplasm of skin: Secondary | ICD-10-CM

## 2018-10-08 DIAGNOSIS — Z8719 Personal history of other diseases of the digestive system: Secondary | ICD-10-CM | POA: Diagnosis not present

## 2018-10-08 DIAGNOSIS — Z800 Family history of malignant neoplasm of digestive organs: Secondary | ICD-10-CM

## 2018-10-08 DIAGNOSIS — E7800 Pure hypercholesterolemia, unspecified: Secondary | ICD-10-CM | POA: Diagnosis present

## 2018-10-08 DIAGNOSIS — Z841 Family history of disorders of kidney and ureter: Secondary | ICD-10-CM

## 2018-10-08 DIAGNOSIS — N184 Chronic kidney disease, stage 4 (severe): Secondary | ICD-10-CM | POA: Diagnosis present

## 2018-10-08 DIAGNOSIS — C19 Malignant neoplasm of rectosigmoid junction: Secondary | ICD-10-CM | POA: Diagnosis present

## 2018-10-08 DIAGNOSIS — K5790 Diverticulosis of intestine, part unspecified, without perforation or abscess without bleeding: Secondary | ICD-10-CM | POA: Diagnosis present

## 2018-10-08 DIAGNOSIS — I129 Hypertensive chronic kidney disease with stage 1 through stage 4 chronic kidney disease, or unspecified chronic kidney disease: Secondary | ICD-10-CM | POA: Diagnosis present

## 2018-10-08 DIAGNOSIS — K567 Ileus, unspecified: Secondary | ICD-10-CM | POA: Diagnosis not present

## 2018-10-08 DIAGNOSIS — H3530 Unspecified macular degeneration: Secondary | ICD-10-CM | POA: Diagnosis present

## 2018-10-08 DIAGNOSIS — D631 Anemia in chronic kidney disease: Secondary | ICD-10-CM | POA: Diagnosis present

## 2018-10-08 DIAGNOSIS — E785 Hyperlipidemia, unspecified: Secondary | ICD-10-CM | POA: Diagnosis present

## 2018-10-08 DIAGNOSIS — Z8249 Family history of ischemic heart disease and other diseases of the circulatory system: Secondary | ICD-10-CM

## 2018-10-08 DIAGNOSIS — C184 Malignant neoplasm of transverse colon: Principal | ICD-10-CM | POA: Diagnosis present

## 2018-10-08 DIAGNOSIS — Z9049 Acquired absence of other specified parts of digestive tract: Secondary | ICD-10-CM | POA: Diagnosis not present

## 2018-10-08 DIAGNOSIS — I2510 Atherosclerotic heart disease of native coronary artery without angina pectoris: Secondary | ICD-10-CM | POA: Diagnosis present

## 2018-10-08 DIAGNOSIS — C189 Malignant neoplasm of colon, unspecified: Secondary | ICD-10-CM | POA: Diagnosis present

## 2018-10-08 HISTORY — PX: LAPAROSCOPIC RIGHT HEMI COLECTOMY: SHX5926

## 2018-10-08 LAB — TYPE AND SCREEN
ABO/RH(D): A POS
Antibody Screen: NEGATIVE

## 2018-10-08 LAB — GLUCOSE, CAPILLARY
GLUCOSE-CAPILLARY: 161 mg/dL — AB (ref 70–99)
Glucose-Capillary: 151 mg/dL — ABNORMAL HIGH (ref 70–99)

## 2018-10-08 SURGERY — LAPAROSCOPIC RIGHT HEMI COLECTOMY
Anesthesia: General | Site: Abdomen | Laterality: Right

## 2018-10-08 MED ORDER — ALUM & MAG HYDROXIDE-SIMETH 200-200-20 MG/5ML PO SUSP
30.0000 mL | Freq: Four times a day (QID) | ORAL | Status: DC | PRN
  Administered 2018-10-10: 30 mL via ORAL
  Filled 2018-10-08: qty 30

## 2018-10-08 MED ORDER — ONDANSETRON HCL 4 MG/2ML IJ SOLN
4.0000 mg | Freq: Four times a day (QID) | INTRAMUSCULAR | Status: DC | PRN
  Filled 2018-10-08 (×3): qty 2

## 2018-10-08 MED ORDER — LACTATED RINGERS IV SOLN: INTRAVENOUS | Status: DC

## 2018-10-08 MED ORDER — SODIUM CHLORIDE (PF) 0.9 % IJ SOLN
INTRAMUSCULAR | Status: AC
  Filled 2018-10-08: qty 20

## 2018-10-08 MED ORDER — ATENOLOL 25 MG PO TABS
25.0000 mg | ORAL_TABLET | Freq: Every day | ORAL | Status: DC
  Administered 2018-10-08 – 2018-10-11 (×4): 25 mg via ORAL
  Filled 2018-10-08 (×4): qty 1

## 2018-10-08 MED ORDER — KETAMINE HCL 10 MG/ML IJ SOLN
INTRAMUSCULAR | Status: AC
  Filled 2018-10-08: qty 1

## 2018-10-08 MED ORDER — HYDROMORPHONE HCL 1 MG/ML IJ SOLN: 0.5000 mg | INTRAMUSCULAR | Status: DC | PRN

## 2018-10-08 MED ORDER — FENTANYL CITRATE (PF) 100 MCG/2ML IJ SOLN
INTRAMUSCULAR | Status: DC | PRN
  Administered 2018-10-08 (×2): 50 ug via INTRAVENOUS

## 2018-10-08 MED ORDER — DIPHENHYDRAMINE HCL 12.5 MG/5ML PO ELIX: 12.5000 mg | ORAL_SOLUTION | Freq: Four times a day (QID) | ORAL | Status: DC | PRN

## 2018-10-08 MED ORDER — FENTANYL CITRATE (PF) 250 MCG/5ML IJ SOLN
INTRAMUSCULAR | Status: AC
  Filled 2018-10-08: qty 5

## 2018-10-08 MED ORDER — ONDANSETRON HCL 4 MG PO TABS: 4.0000 mg | ORAL_TABLET | Freq: Four times a day (QID) | ORAL | Status: DC | PRN

## 2018-10-08 MED ORDER — GLYCOPYRROLATE 0.2 MG/ML IJ SOLN
INTRAMUSCULAR | Status: DC | PRN
  Administered 2018-10-08: 0.2 mg via INTRAVENOUS
  Administered 2018-10-08: .1 mg via INTRAVENOUS

## 2018-10-08 MED ORDER — INSULIN ASPART 100 UNIT/ML ~~LOC~~ SOLN: 0.0000 [IU] | Freq: Every day | SUBCUTANEOUS | Status: DC

## 2018-10-08 MED ORDER — ALVIMOPAN 12 MG PO CAPS
12.0000 mg | ORAL_CAPSULE | Freq: Two times a day (BID) | ORAL | Status: DC
  Administered 2018-10-09: 12 mg via ORAL
  Filled 2018-10-08: qty 1

## 2018-10-08 MED ORDER — SODIUM CHLORIDE (PF) 0.9 % IJ SOLN
INTRAMUSCULAR | Status: DC | PRN
  Administered 2018-10-08: 15 mL

## 2018-10-08 MED ORDER — PROPOFOL 10 MG/ML IV BOLUS
INTRAVENOUS | Status: DC | PRN
  Administered 2018-10-08: 75 mg via INTRAVENOUS
  Administered 2018-10-08: 125 mg via INTRAVENOUS

## 2018-10-08 MED ORDER — LIDOCAINE 2% (20 MG/ML) 5 ML SYRINGE
INTRAMUSCULAR | Status: DC | PRN
  Administered 2018-10-08: 1.5 mg/kg/h via INTRAVENOUS

## 2018-10-08 MED ORDER — GABAPENTIN 300 MG PO CAPS
300.0000 mg | ORAL_CAPSULE | ORAL | Status: AC
  Administered 2018-10-08: 300 mg via ORAL
  Filled 2018-10-08: qty 1

## 2018-10-08 MED ORDER — 0.9 % SODIUM CHLORIDE (POUR BTL) OPTIME
TOPICAL | Status: DC | PRN
  Administered 2018-10-08: 2000 mL

## 2018-10-08 MED ORDER — ENSURE SURGERY PO LIQD
237.0000 mL | Freq: Two times a day (BID) | ORAL | Status: DC
  Administered 2018-10-09 (×2): 237 mL via ORAL
  Filled 2018-10-08 (×4): qty 237

## 2018-10-08 MED ORDER — SODIUM CHLORIDE 0.9 % IV SOLN
INTRAVENOUS | Status: DC | PRN
  Administered 2018-10-08: 08:00:00 via INTRAVENOUS

## 2018-10-08 MED ORDER — BUPIVACAINE-EPINEPHRINE (PF) 0.5% -1:200000 IJ SOLN
INTRAMUSCULAR | Status: DC | PRN
  Administered 2018-10-08: 15 mL

## 2018-10-08 MED ORDER — HYDROMORPHONE HCL 1 MG/ML IJ SOLN
INTRAMUSCULAR | Status: AC
  Filled 2018-10-08: qty 1

## 2018-10-08 MED ORDER — LACTATED RINGERS IR SOLN
Status: DC | PRN
  Administered 2018-10-08: 1000 mL

## 2018-10-08 MED ORDER — LACTATED RINGERS IV SOLN
INTRAVENOUS | Status: DC | PRN
  Administered 2018-10-08: 06:00:00 via INTRAVENOUS

## 2018-10-08 MED ORDER — HYDROMORPHONE HCL 1 MG/ML IJ SOLN
0.2500 mg | INTRAMUSCULAR | Status: DC | PRN
  Administered 2018-10-08: 0.25 mg via INTRAVENOUS

## 2018-10-08 MED ORDER — INSULIN ASPART 100 UNIT/ML ~~LOC~~ SOLN
0.0000 [IU] | Freq: Three times a day (TID) | SUBCUTANEOUS | Status: DC
  Administered 2018-10-08: 2 [IU] via SUBCUTANEOUS
  Administered 2018-10-09: 1 [IU] via SUBCUTANEOUS
  Administered 2018-10-09 – 2018-10-11 (×6): 2 [IU] via SUBCUTANEOUS
  Administered 2018-10-11 – 2018-10-12 (×2): 1 [IU] via SUBCUTANEOUS
  Administered 2018-10-12: 2 [IU] via SUBCUTANEOUS
  Administered 2018-10-12: 1 [IU] via SUBCUTANEOUS
  Administered 2018-10-13: 2 [IU] via SUBCUTANEOUS
  Administered 2018-10-13: 1 [IU] via SUBCUTANEOUS
  Administered 2018-10-13: 2 [IU] via SUBCUTANEOUS
  Administered 2018-10-14: 1 [IU] via SUBCUTANEOUS

## 2018-10-08 MED ORDER — ALVIMOPAN 12 MG PO CAPS
12.0000 mg | ORAL_CAPSULE | ORAL | Status: AC
  Administered 2018-10-08: 12 mg via ORAL
  Filled 2018-10-08: qty 1

## 2018-10-08 MED ORDER — ASPIRIN EC 81 MG PO TBEC
81.0000 mg | DELAYED_RELEASE_TABLET | Freq: Every day | ORAL | Status: DC
  Administered 2018-10-09 – 2018-10-13 (×5): 81 mg via ORAL
  Filled 2018-10-08 (×5): qty 1

## 2018-10-08 MED ORDER — SUGAMMADEX SODIUM 500 MG/5ML IV SOLN
INTRAVENOUS | Status: DC | PRN
  Administered 2018-10-08: 200 mg via INTRAVENOUS

## 2018-10-08 MED ORDER — CHLORHEXIDINE GLUCONATE CLOTH 2 % EX PADS: 6.0000 | MEDICATED_PAD | Freq: Once | CUTANEOUS | Status: DC

## 2018-10-08 MED ORDER — ROCURONIUM BROMIDE 100 MG/10ML IV SOLN
INTRAVENOUS | Status: DC | PRN
  Administered 2018-10-08: 60 mg via INTRAVENOUS

## 2018-10-08 MED ORDER — DEXAMETHASONE SODIUM PHOSPHATE 10 MG/ML IJ SOLN
INTRAMUSCULAR | Status: DC | PRN
  Administered 2018-10-08: 8 mg via INTRAVENOUS

## 2018-10-08 MED ORDER — MIDAZOLAM HCL 5 MG/5ML IJ SOLN
INTRAMUSCULAR | Status: DC | PRN
  Administered 2018-10-08 (×2): 1 mg via INTRAVENOUS

## 2018-10-08 MED ORDER — SODIUM CHLORIDE 0.9 % IV SOLN
2.0000 g | INTRAVENOUS | Status: AC
  Administered 2018-10-08: 2 g via INTRAVENOUS
  Filled 2018-10-08: qty 2

## 2018-10-08 MED ORDER — BUPIVACAINE LIPOSOME 1.3 % IJ SUSP
INTRAMUSCULAR | Status: DC | PRN
  Administered 2018-10-08: 20 mL

## 2018-10-08 MED ORDER — ATORVASTATIN CALCIUM 40 MG PO TABS
40.0000 mg | ORAL_TABLET | Freq: Every day | ORAL | Status: DC
  Administered 2018-10-08 – 2018-10-13 (×6): 40 mg via ORAL
  Filled 2018-10-08 (×6): qty 1

## 2018-10-08 MED ORDER — ACETAMINOPHEN 500 MG PO TABS
1000.0000 mg | ORAL_TABLET | Freq: Four times a day (QID) | ORAL | Status: DC
  Administered 2018-10-08 – 2018-10-13 (×17): 1000 mg via ORAL
  Filled 2018-10-08 (×20): qty 2

## 2018-10-08 MED ORDER — HYDRALAZINE HCL 20 MG/ML IJ SOLN
10.0000 mg | INTRAMUSCULAR | Status: AC | PRN
  Administered 2018-10-10 – 2018-10-11 (×4): 10 mg via INTRAVENOUS
  Filled 2018-10-08 (×4): qty 1

## 2018-10-08 MED ORDER — SODIUM CHLORIDE 0.9 % IV SOLN
INTRAVENOUS | Status: DC | PRN
  Administered 2018-10-08: 20 ug/min via INTRAVENOUS

## 2018-10-08 MED ORDER — LIDOCAINE HCL (CARDIAC) PF 100 MG/5ML IV SOSY
PREFILLED_SYRINGE | INTRAVENOUS | Status: DC | PRN
  Administered 2018-10-08: 80 mg via INTRAVENOUS

## 2018-10-08 MED ORDER — PROPOFOL 10 MG/ML IV BOLUS
INTRAVENOUS | Status: AC
  Filled 2018-10-08: qty 20

## 2018-10-08 MED ORDER — ONDANSETRON HCL 4 MG/2ML IJ SOLN
INTRAMUSCULAR | Status: DC | PRN
  Administered 2018-10-08: 4 mg via INTRAVENOUS

## 2018-10-08 MED ORDER — HEPARIN SODIUM (PORCINE) 5000 UNIT/ML IJ SOLN
5000.0000 [IU] | Freq: Three times a day (TID) | INTRAMUSCULAR | Status: DC
  Administered 2018-10-09 – 2018-10-14 (×16): 5000 [IU] via SUBCUTANEOUS
  Filled 2018-10-08 (×16): qty 1

## 2018-10-08 MED ORDER — DIPHENHYDRAMINE HCL 50 MG/ML IJ SOLN: 12.5000 mg | Freq: Four times a day (QID) | INTRAMUSCULAR | Status: DC | PRN

## 2018-10-08 MED ORDER — HEPARIN SODIUM (PORCINE) 5000 UNIT/ML IJ SOLN
5000.0000 [IU] | Freq: Once | INTRAMUSCULAR | Status: AC
  Administered 2018-10-08: 5000 [IU] via SUBCUTANEOUS
  Filled 2018-10-08: qty 1

## 2018-10-08 MED ORDER — TRAMADOL HCL 50 MG PO TABS
50.0000 mg | ORAL_TABLET | Freq: Four times a day (QID) | ORAL | Status: DC | PRN
  Administered 2018-10-08 – 2018-10-09 (×2): 50 mg via ORAL
  Filled 2018-10-08 (×4): qty 1

## 2018-10-08 MED ORDER — SODIUM CHLORIDE 0.9 % IV SOLN
INTRAVENOUS | Status: DC
  Administered 2018-10-08: 06:00:00 via INTRAVENOUS

## 2018-10-08 MED ORDER — LACTATED RINGERS IV SOLN
INTRAVENOUS | Status: DC
  Administered 2018-10-08: 13:00:00 via INTRAVENOUS

## 2018-10-08 MED ORDER — ACETAMINOPHEN 500 MG PO TABS
1000.0000 mg | ORAL_TABLET | ORAL | Status: AC
  Administered 2018-10-08: 1000 mg via ORAL
  Filled 2018-10-08: qty 2

## 2018-10-08 MED ORDER — MIDAZOLAM HCL 2 MG/2ML IJ SOLN
INTRAMUSCULAR | Status: AC
  Filled 2018-10-08: qty 2

## 2018-10-08 SURGICAL SUPPLY — 70 items
ADH SKN CLS APL DERMABOND .7 (GAUZE/BANDAGES/DRESSINGS) ×1
APPLIER CLIP ROT 10 11.4 M/L (STAPLE)
APR CLP MED LRG 11.4X10 (STAPLE)
BLADE CLIPPER SURG (BLADE) IMPLANT
CABLE HIGH FREQUENCY MONO STRZ (ELECTRODE) ×2 IMPLANT
CELLS DAT CNTRL 66122 CELL SVR (MISCELLANEOUS) IMPLANT
CLIP APPLIE ROT 10 11.4 M/L (STAPLE) IMPLANT
COVER WAND RF STERILE (DRAPES) ×2 IMPLANT
DECANTER SPIKE VIAL GLASS SM (MISCELLANEOUS) ×5 IMPLANT
DERMABOND ADVANCED (GAUZE/BANDAGES/DRESSINGS) ×2
DERMABOND ADVANCED .7 DNX12 (GAUZE/BANDAGES/DRESSINGS) IMPLANT
DISSECTOR BLUNT TIP ENDO 5MM (MISCELLANEOUS) IMPLANT
DRSG OPSITE POSTOP 4X8 (GAUZE/BANDAGES/DRESSINGS) ×2 IMPLANT
ELECT REM PT RETURN 15FT ADLT (MISCELLANEOUS) ×3 IMPLANT
GAUZE SPONGE 4X4 12PLY STRL (GAUZE/BANDAGES/DRESSINGS) ×1 IMPLANT
GLOVE BIO SURGEON STRL SZ7.5 (GLOVE) ×6 IMPLANT
GLOVE BIOGEL PI IND STRL 6.5 (GLOVE) IMPLANT
GLOVE BIOGEL PI IND STRL 7.0 (GLOVE) IMPLANT
GLOVE BIOGEL PI IND STRL 7.5 (GLOVE) IMPLANT
GLOVE BIOGEL PI INDICATOR 6.5 (GLOVE) ×4
GLOVE BIOGEL PI INDICATOR 7.0 (GLOVE) ×8
GLOVE BIOGEL PI INDICATOR 7.5 (GLOVE) ×4
GLOVE ECLIPSE 6.5 STRL STRAW (GLOVE) ×4 IMPLANT
GLOVE ECLIPSE 8.0 STRL XLNG CF (GLOVE) ×6 IMPLANT
GLOVE SURG SIGNA 7.5 PF LTX (GLOVE) ×4 IMPLANT
GLOVE SURG SS PI 7.0 STRL IVOR (GLOVE) ×4 IMPLANT
GOWN STRL REUS W/ TWL LRG LVL3 (GOWN DISPOSABLE) IMPLANT
GOWN STRL REUS W/TWL LRG LVL3 (GOWN DISPOSABLE) ×6
GOWN STRL REUS W/TWL XL LVL3 (GOWN DISPOSABLE) ×20 IMPLANT
LIGASURE IMPACT 36 18CM CVD LR (INSTRUMENTS) IMPLANT
PACK COLON (CUSTOM PROCEDURE TRAY) ×3 IMPLANT
PAD POSITIONING PINK XL (MISCELLANEOUS) ×2 IMPLANT
PORT LAP GEL ALEXIS MED 5-9CM (MISCELLANEOUS) ×2 IMPLANT
PROTECTOR NERVE ULNAR (MISCELLANEOUS) ×2 IMPLANT
RELOAD PROXIMATE 75MM BLUE (ENDOMECHANICALS) ×6 IMPLANT
RELOAD STAPLE 75 3.8 BLU REG (ENDOMECHANICALS) IMPLANT
RETRACTOR WND ALEXIS 18 MED (MISCELLANEOUS) IMPLANT
RTRCTR WOUND ALEXIS 18CM MED (MISCELLANEOUS)
SCISSORS LAP 5X35 DISP (ENDOMECHANICALS) ×3 IMPLANT
SEALER TISSUE G2 STRG ARTC 35C (ENDOMECHANICALS) ×2 IMPLANT
SET IRRIG TUBING LAPAROSCOPIC (IRRIGATION / IRRIGATOR) ×2 IMPLANT
SET TUBE SMOKE EVAC HIGH FLOW (TUBING) ×3 IMPLANT
SHEARS HARMONIC ACE PLUS 36CM (ENDOMECHANICALS) IMPLANT
SLEEVE ADV FIXATION 5X100MM (TROCAR) ×6 IMPLANT
SLEEVE ENDOPATH XCEL 5M (ENDOMECHANICALS) ×3 IMPLANT
SPONGE LAP 18X18 X RAY DECT (DISPOSABLE) ×2 IMPLANT
STAPLER GUN LINEAR PROX 60 (STAPLE) ×2 IMPLANT
STAPLER PROXIMATE 75MM BLUE (STAPLE) ×2 IMPLANT
STAPLER VISISTAT 35W (STAPLE) ×1 IMPLANT
SUT MNCRL AB 4-0 PS2 18 (SUTURE) ×5 IMPLANT
SUT PDS AB 1 CT1 27 (SUTURE) ×6 IMPLANT
SUT PROLENE 2 0 CT2 30 (SUTURE) IMPLANT
SUT PROLENE 2 0 KS (SUTURE) IMPLANT
SUT SILK 2 0 (SUTURE) ×3
SUT SILK 2 0 SH CR/8 (SUTURE) ×1 IMPLANT
SUT SILK 2-0 18XBRD TIE 12 (SUTURE) ×1 IMPLANT
SUT SILK 3 0 (SUTURE) ×3
SUT SILK 3 0 SH CR/8 (SUTURE) ×3 IMPLANT
SUT SILK 3-0 18XBRD TIE 12 (SUTURE) ×1 IMPLANT
SYS LAPSCP GELPORT 120MM (MISCELLANEOUS)
SYSTEM LAPSCP GELPORT 120MM (MISCELLANEOUS) IMPLANT
TAPE CLOTH 4X10 WHT NS (GAUZE/BANDAGES/DRESSINGS) IMPLANT
TOWEL OR 17X26 10 PK STRL BLUE (TOWEL DISPOSABLE) ×3 IMPLANT
TRAY FOLEY MTR SLVR 16FR STAT (SET/KITS/TRAYS/PACK) ×2 IMPLANT
TROCAR ADV FIXATION 5X100MM (TROCAR) ×3 IMPLANT
TROCAR BALLN 12MMX100 BLUNT (TROCAR) ×3 IMPLANT
TROCAR XCEL NON-BLD 11X100MML (ENDOMECHANICALS) IMPLANT
TUBING CONNECTING 10 (TUBING) ×4 IMPLANT
TUBING CONNECTING 10' (TUBING) ×2
YANKAUER SUCT BULB TIP NO VENT (SUCTIONS) ×3 IMPLANT

## 2018-10-08 NOTE — Anesthesia Procedure Notes (Signed)
Procedure Name: Intubation Date/Time: 10/08/2018 7:37 AM Performed by: Roderic Palau, MD Pre-anesthesia Checklist: Patient identified, Emergency Drugs available, Suction available, Timeout performed and Patient being monitored Patient Re-evaluated:Patient Re-evaluated prior to induction Oxygen Delivery Method: Circle system utilized Preoxygenation: Pre-oxygenation with 100% oxygen Induction Type: IV induction Ventilation: Mask ventilation without difficulty and Oral airway inserted - appropriate to patient size Laryngoscope Size: Glidescope and 4 Grade View: Grade III Tube type: Oral Number of attempts: 1 Airway Equipment and Method: Video-laryngoscopy Placement Confirmation: ETT inserted through vocal cords under direct vision and breath sounds checked- equal and bilateral Secured at: 21 cm Tube secured with: Tape Dental Injury: Teeth and Oropharynx as per pre-operative assessment  Difficulty Due To: Difficulty was anticipated, Difficult Airway- due to reduced neck mobility, Difficult Airway- due to dentition, Difficult Airway- due to limited oral opening and Difficult Airway- due to anterior larynx Future Recommendations: Recommend- induction with short-acting agent, and alternative techniques readily available Comments: Elective glidescope due to anatomy.  Cords vis. With head lift.

## 2018-10-08 NOTE — Anesthesia Postprocedure Evaluation (Signed)
Anesthesia Post Note  Patient: Bradley [name]  Procedure(s) Performed: LAPAROSCOPIC RIGHT HEMI COLECTOMY ERAS PATHWAY (Right Abdomen)     Patient location during evaluation: PACU Anesthesia Type: General Level of consciousness: awake and alert Pain management: pain level controlled Vital Signs Assessment: post-procedure vital signs reviewed and stable Respiratory status: spontaneous breathing, nonlabored ventilation, respiratory function stable and patient connected to nasal cannula oxygen Cardiovascular status: blood pressure returned to baseline and stable Postop Assessment: no apparent nausea or vomiting Anesthetic complications: no    Last Vitals:  Vitals:   10/08/18 1145 10/08/18 1200  BP: (!) 152/71 (!) 157/69  Pulse: 70 71  Resp: 14 16  Temp: (!) 36.3 C (!) 36.3 C  SpO2: 100% 99%    Last Pain:  Vitals:   10/08/18 1200  TempSrc:   PainSc: Asleep                 [name],W. EDMOND

## 2018-10-08 NOTE — Transfer of Care (Signed)
Immediate Anesthesia Transfer of Care Note  Patient: Bradley [name]  Procedure(s) Performed: LAPAROSCOPIC RIGHT HEMI COLECTOMY ERAS PATHWAY (Right Abdomen)  Patient Location: PACU  Anesthesia Type:General  Level of Consciousness: awake, oriented, drowsy and patient cooperative  Airway & Oxygen Therapy: Patient Spontanous Breathing and Patient connected to face mask oxygen  Post-op Assessment: Report given to RN, Post -op Vital signs reviewed and stable and Patient moving all extremities  Post vital signs: Reviewed and stable  Last Vitals:  Vitals Value Taken Time  BP 159/79 10/08/2018 11:00 AM  Temp    Pulse 79 10/08/2018 11:02 AM  Resp 14 10/08/2018 11:02 AM  SpO2 100 % 10/08/2018 11:02 AM  Vitals shown include unvalidated device data.  Last Pain:  Vitals:   10/08/18 0538  TempSrc: Oral      Patients Stated Pain Goal: 4 (24/58/09 9833)  Complications: No apparent anesthesia complications

## 2018-10-08 NOTE — Op Note (Signed)
PATIENT: Bradley [name]  85 y.o. male  Patient Care Team: Brackman, Saundra Shelling, DO as PCP - General (Internal Medicine)  PREOP DIAGNOSIS: Colon cancer  POSTOP DIAGNOSIS: Same  PROCEDURE: Laparoscopic right hemicolectomy  SURGEON: Sharon Mt. Dema Severin, MD  ASSISTANT: Coralie Keens, MD  ANESTHESIA: General endotracheal  EBL: 50 mL Total I/O In: -  Out: 086 [Urine:225; Other:200; Blood:50]  DRAINS: None  SPECIMEN: Right colon + terminal ileum as one unit  COUNTS: Sponge, needle and instrument counts were reported correct x2  FINDINGS: Mass in proximal transverse colon near hepatic flexure; tattoo distal to this. Cecum appeared relatively normal. No obvious metastatic disease on visceral or parietal peritoneum or liver. Region of colon that contained the mass was quite adherent to the liver in the location of the gallbladder fossa but did not appear to involve the liver. A prior laparoscopic clip was actually seen on the omentum overlying the gallbladder fossa and was included with the specimen. During extraction of the specimen, despite gentle traction on the colon where the mass was located, it partially fractured. There was no spillage.  STATEMENT OF MEDICAL NECESSITY: Mr. Stogsdill is a very pleasant 66yoM with hx of HTN, HLD, CKD, CAD who underwent a colonoscopy on 08/04/18 with Dr. Lyndel Safe. This was done for heme positive stool. He reports a two-year history of anemia with hemoglobin typically hovering around 10-11. He denies ever having noticed frank blood in his stool or discolored stool. He denies any changes in weight. He denies any abdominal pain. He denies any nausea/vomiting. His colonoscopy was completed 08/04/18 which demonstrated: 1. Fungating mass in the cecum-biopsies returned invasive adenocarcinoma. This was tattooed distal to the lesion. 2. 4 polyps in the transverse colon which were removed-tubular adenomas 3. Polyp in the sigmoid colon which was  removed-tubular adenoma  He subsequently underwent CT abdomen and pelvis without contrast due to underlying CKD and this demonstrated a mass in the cecum as well as a questionable area of thickening in the hepatic flexure. There is no evidence of metastatic disease in the abdomen/pelvis. His CEA was 2.3.  He reports having had a colonoscopy approximately 8 years prior and has a personal history of polyps. He denies any personal or family history of colorectal cancer.  CT chest has been obtained (without contrast) and showed no evidence of metastatic disease. He denies any changes in his health or history/medications since being seen in our office 08/31/18. Cardiac clearance obtained by Dr. Rosana Hoes  Options were discussed moving forward and he opted to undergo surgery - please refer to notes elsewhere for details regarding this discussion.  NARRATIVE:  The patient was identified & brought into the operating room, placed supine on the operating table and SCDs were applied to the lower extremities. General endotracheal anesthesia was induced. He was then positioned supine with arms tucked. Antibiotics were administered. A foley catheter was placed under sterile conditions. Hair in the region of planned surgery was clipped by the OR staff. The abdomen was prepped and draped in a sterile fashion. A timeout was performed confirming our patient and plan.   Beginning with the extraction site, a supraumbilical incision was made and carried down to the midline fascia. This was then incised with electrocautery. The peritoneum was identified and elevated between clamps and carefully opened sharply. A small Alexis wound protector with a cap and associated port was then placed. The abdomen was insufflated to 15 mmHg with CO2. A port was placed in the gel port. A laparoscope was  inserted and camera inspection revealed no evidence of injury. Bilateral TAP blocks were then performed under laparoscopic visualization  using a mixture of 0.25% marcaine with epinepherine + Exparel. Additional ports were then placed under direct laparoscopic visualization - two in the left hemiabdomen and one in the right abdomen. The abdomen was surveyed. The liver and peritoneum appeared normal.  There were no signs of metastatic disease.  The tattoo was identified in the proximal transverse colon.  An area of thickening and firmness was identified just beyond the hepatic flexure and the transverse colon.  The cecum had some scar tissue around it related the prior appendectomy but appeared relatively normal.  The ascending colon also appeared relatively normal.  The terminal ileum appeared normal.  The patient was positioned in trendelenburg with left side down.  The terminal ileum and cecum were inspected and due to his habitus and thickness of the mesentery of the small bowel, the decision was made to pursue a lateral to medial approach.  The terminal ileum was grasped gently and retracted medially.  Attachments of the terminal ileum to the retroperitoneum were sharply incised taking care to preserve the retroperitoneal structures.  The cecum was then grasped and retracted medially.  Along the Xzavien Harada line of Toldt, the cecum and ascending colon were mobilized.  Attachments of the hepatic flexure were also then taken down.  Attention was then turned to mobilizing the transverse colon.  The omentum was retracted anteriorly and the colon retracted inferiorly.  The lesser sac was entered.  Omentum was taken off at this level working towards the tattoo.  At the location of his likely cancer in the proximal transverse colon, it was dense adherent to his gallbladder fossa.  He had a history of gangrenous cholecystitis and underwent a laparoscopic cholecystectomy previously.  At this location there were significant adhesions between the colon and the liver.  These adhesions were carefully taken down using the Enseal.  Hemostasis was verified on the  liver.  There did not appear to be any involvement of the tumor with the gallbladder fossa.  All these adhesions appeared to be from his prior cholecystectomy.  A laparoscopic clip was identified on the omentum at the hepatic flexure and was left in situ and subsequently removed with the specimen.  After attachments from the transverse colon the gallbladder fossa were freed the remainder of the hepatic flexure was taken down.  The duodenum was identified during this portion of the dissection and preserved free of injury.  At this point, the terminal ileum and cecum were able to be retracted in the left upper quadrant and sufficient length of been obtained to turn our attention to the extracorporeal portion of the procedure.  The abdomen was desufflated and the cap on the wound protector was removed.  The cecum and terminal ileum were eviscerated.  The peritoneum overlying the terminal ileum mesentery was scored.  The ileocolic pedicle was identified.  This was circumferentially dissected and after confirming that the duodenum was down, was ligated using the Enseal device.  The pedicle was inspected and noted to be hemostatic.  A window in the distal ileum mesentery was created on the mesenteric border of the bowel.  The terminal ileum was then divided using a GIA blue load stapler.  The remaining mesentery was ligated using the Enseal device working up to the transverse colon.  Just distal to the tattoo, the mesentery was inspected.  The middle colic pedicle appeared to be well distal to the mass.  Given this finding, the decision was made to take the right branch of the middle colic but preserve the main middle colic pedicle.  At the location of the middle colic pedicle, where the colon appeared quite well perfused and healthy, a window was created in the mesenteric border of the transverse colon.  This was then divided using a GIA blue load stapler.  Including the right branch of middle colic the remaining  mesentery to the specimen was ligated using the Enseal device.  The mesentery was then reinspected and noted to be hemostatic.  The specimen was passed off.   Attention was turned to creating the ileocolic anastomosis. The terminal ileum and transverse colon were inspected for orientation to ensure no twisting nor bowel included in the mesenteric defect. An anastomosis was created between the terminal ileum and the transverse colon using a 75 mm GIA blue load stapler. The staple line was inspected and noted to be hemostatic. The common enterotomy was grasped with Allis clamps.  The common enterotomy channel was then closed using a TA 60 blue load stapler. Hemostasis was present at the staple line. 3-0 silk sutures were used to imbricate the corners of the staple line as well.  A 2-0 silk suture was placed securing the "crotch" of the anastomosis. The anastomosis was palpated and noted to be widely patent. This was then placed back into the abdomen. The 5 mm ports were then removed under direct visualization and noted to be hemostatic. The abdomen was then irrigated with sterile saline and hemostasis verified. The omentum was then brought down over the anastomosis. The Alexis wound protector was removed, counts were reported correct, and we switched to clean instruments, gowns and drapes.   The fascia was then closed using two running #1 PDS sutures. The fascia was then palpated and noted to be completely closed.  The skin of all incision sites was closed with 4-0 monocryl subcuticular suture. Dermabond was placed on the port sites and a sterile dressing was placed over the abdominal incision. All counts were reported correct. The patient was then awakened from anesthesia and sent to the post anesthesia care unit in stable condition.   DISPOSITION: PACU in satisfactory condition

## 2018-10-08 NOTE — H&P (Signed)
CC: Here today for surgery  HPI: Bradley [name] is a very pleasant 44yoM with hx of HTN, HLD, CKD, CAD who underwent a colonoscopy on 08/04/18 with Dr. Lyndel Safe. This was done for heme positive stool. He reports a two-year history of anemia with hemoglobin typically hovering around 10-11. He denies ever having noticed frank blood in his stool or discolored stool. He denies any changes in weight. He denies any abdominal pain. He denies any nausea/vomiting. His colonoscopy was completed 08/04/18 which demonstrated: 1. Fungating mass in the cecum-biopsies returned invasive adenocarcinoma. This was tattooed distal to the lesion. 2. 4 polyps in the transverse colon which were removed-tubular adenomas 3. Polyp in the sigmoid colon which was removed-tubular adenoma  He subsequently underwent CT abdomen and pelvis without contrast due to underlying CKD and this demonstrated a mass in the cecum as well as a questionable area of thickening in the hepatic flexure. There is no evidence of metastatic disease in the abdomen/pelvis. His CEA was 2.3.  He reports having had a colonoscopy approximately 8 years prior and has a personal history of polyps. He denies any personal or family history of colorectal cancer.  CT chest has been obtained (without contrast) and showed no evidence of metastatic disease. He denies any changes in his health or history/medications since being seen in our office 08/31/18. Cardiac clearance obtained by Dr. Rosana Hoes  PMH: HTN, HLD, CKD, CAD - remote cardiac event - 1999 - underwent cardiac catheter and angioplasty but denies having had a stent placed. This was done by Dr. Kandis Fantasia in [place]. He is currently under the care of Dr. Dede Query who is his primary cardiologist. He states he is planning to switch back to Dr. Kandis Fantasia in near future. He reports having seen Dr. Rosana Hoes him back in November of this year  PSH: Laparoscopic cholecystectomy (Dr. Margot Chimes); open appendectomy  in the 1960s-Dr. Vida Rigger  FHx: Denies FHx of malignancy.  Social: Denies use of tobacco/drugs; 2-3 glasses wine per month. 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 Former patient of Dr. Osborn Coho; has worked with Dr. Leafy Kindle & Vida Rigger as well.  ROS: A comprehensive 10 system review of systems was completed with the patient and pertinent findings as noted above.  Past Medical History:  Diagnosis Date  . Anemia    HEME OCCULT POSITIVE   . CAD (coronary artery disease)    THEY FOUND A BLOCKAGE IN 1999 DURNG A CATHERIZATION , NOT ENOUGH TO  NEED A STENT , THEY JUST CLEANED ME OUT   . Cancer [facility])    SKIN CANCER   . Chronic kidney disease   . Colon cancer ([place])   . Colon polyps 03/2004  . Diverticulosis   . 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 Dyspnea    ON EXERTION   . Elevated cholesterol   . High blood pressure     Past Surgical History:  Procedure Laterality Date  . ANGIOPLASTY  1999  . APPENDECTOMY    . CATARACT EXTRACTION, BILATERAL    . CATHERIZATION   1999  . CHOLECYSTECTOMY    . COLONOSCOPY  07/21/2007   adenomatous polyps diverticulosis  . ESOPHAGOGASTRODUODENOSCOPY  02/07/2012   mild gastritis   . SIGMOIDOSCOPY  07/06/2009   diverticulosis    Family History  Problem Relation Age of Onset  . Esophageal cancer Father   . High blood pressure Mother   . Kidney failure Mother   . High blood pressure Sister   . Colon cancer  Neg Hx     Social:  reports that he has never smoked. He has never used smokeless tobacco. He reports current alcohol use. He reports that he does not use drugs.  Allergies: No Known Allergies  Medications: I have reviewed the patient's current medications.  No results found for this or any previous visit (from the past 48 hour(s)).  No results found.  ROS - all of the below systems have been reviewed with the patient and positives are indicated with bold text General: chills, fever or  night sweats Eyes: blurry vision or double vision ENT: epistaxis or sore throat Allergy/Immunology: itchy/watery eyes or nasal congestion Hematologic/Lymphatic: bleeding problems, blood clots or swollen lymph nodes Endocrine: temperature intolerance or unexpected weight changes Breast: new or changing breast lumps or nipple discharge Resp: cough, shortness of breath, or wheezing CV: chest pain or dyspnea on exertion GI: as per HPI GU: dysuria, trouble voiding, or hematuria MSK: joint pain or joint stiffness Neuro: TIA or stroke symptoms Derm: pruritus and skin lesion changes Psych: anxiety and depression  PE Blood pressure (!) 154/63, pulse (!) 56, temperature 98 F (36.7 C), temperature source Oral, resp. rate 18, height 5\' 9"  (1.753 m), weight 80.7 kg, SpO2 100 %. Constitutional: NAD; conversant; no deformities Eyes: Moist conjunctiva; no lid lag; anicteric; PERRL Neck: Trachea midline; no thyromegaly Lungs: Normal respiratory effort; no tactile fremitus CV: RRR; no palpable thrills; no pitting edema GI: Abd soft, NT/ND; no palpable hepatosplenomegaly MSK: Normal gait; no clubbing/cyanosis Psychiatric: Appropriate affect; alert and oriented x3 Lymphatic: No palpable cervical or axillary lymphadenopathy  No results found for this or any previous visit (from the past 48 hour(s)).  No results found.   A/P: Bradley [name] is a very pleasant 73yoM with hx of HTN, HLD, CKD, CAD here today with newly diagnosed cancer of the cecum. CT chest shows no evidence of metastatic disease CT abdomen and pelvis without contrast demonstrated no evidence of metastatic disease in the abdomen or pelvis CEA 2.3  -The anatomy and physiology of the GI tract was discussed at length with the patient again today. The pathophysiology of colon cancer was discussed at length. The treatment approach to colon cancer was discussed as well as importance of surveillance -We have discussed options for treatment  moving forward. We discussed surgery-laparoscopic and possible open techniques. We discussed right hemicolectomy. We discussed that without surgery, this wound not go away and would likely ultimately take his life. -The planned procedure, material risks (including, but not limited to, pain, bleeding, infection, scarring, need for blood transfusion, damage to surrounding structures- blood vessels/nerves/viscus/organs, damage to ureter, urine leak, leak from anastomosis, need for additional procedures, need for stoma which may be permanent, worsening of pre-existing medical conditions including possible need for dialysis, DVT/PE, hernia, recurrence of cancer, pneumonia, heart attack, stroke, death) benefits and alternatives to surgery were discussed at length. The patient's questions and his wife's were answered to their satisfaction, they voiced understanding and elected to proceed with surgery. Additionally, we discussed typical postoperative expectations and the recovery process.  Sharon Mt. Dema Severin, M.D. General and Colorectal Surgery [facility]

## 2018-10-08 NOTE — Progress Notes (Signed)
Pt asked about taking his evening meds MD consulted and advised to hold for now.

## 2018-10-09 ENCOUNTER — Encounter (HOSPITAL_COMMUNITY): Payer: Self-pay | Admitting: Surgery

## 2018-10-09 LAB — BASIC METABOLIC PANEL
Anion gap: 8 (ref 5–15)
BUN: 26 mg/dL — ABNORMAL HIGH (ref 8–23)
CO2: 17 mmol/L — ABNORMAL LOW (ref 22–32)
Calcium: 7.7 mg/dL — ABNORMAL LOW (ref 8.9–10.3)
Chloride: 112 mmol/L — ABNORMAL HIGH (ref 98–111)
Creatinine, Ser: 2.25 mg/dL — ABNORMAL HIGH (ref 0.61–1.24)
GFR calc Af Amer: 30 mL/min — ABNORMAL LOW (ref >60)
GFR calc non Af Amer: 26 mL/min — ABNORMAL LOW (ref >60)
Glucose, Bld: 136 mg/dL — ABNORMAL HIGH (ref 70–99)
Potassium: 5.5 mmol/L — ABNORMAL HIGH (ref 3.5–5.1)
Sodium: 137 mmol/L (ref 135–145)

## 2018-10-09 LAB — CBC
HCT: 32.1 % — ABNORMAL LOW (ref 39.0–52.0)
HEMOGLOBIN: 9.5 g/dL — AB (ref 13.0–17.0)
MCH: 26.5 pg (ref 26.0–34.0)
MCHC: 29.6 g/dL — ABNORMAL LOW (ref 30.0–36.0)
MCV: 89.7 fL (ref 80.0–100.0)
Platelets: 172 10*3/uL (ref 150–400)
RBC: 3.58 MIL/uL — ABNORMAL LOW (ref 4.22–5.81)
RDW: 15.5 % (ref 11.5–15.5)
WBC: 14.9 10*3/uL — ABNORMAL HIGH (ref 4.0–10.5)
nRBC: 0 % (ref 0.0–0.2)

## 2018-10-09 LAB — GLUCOSE, CAPILLARY
GLUCOSE-CAPILLARY: 144 mg/dL — AB (ref 70–99)
Glucose-Capillary: 120 mg/dL — ABNORMAL HIGH (ref 70–99)
Glucose-Capillary: 168 mg/dL — ABNORMAL HIGH (ref 70–99)
Glucose-Capillary: 134 mg/dL — ABNORMAL HIGH (ref 70–99)

## 2018-10-09 LAB — MAGNESIUM: MAGNESIUM: 1.9 mg/dL (ref 1.7–2.4)

## 2018-10-09 LAB — PHOSPHORUS: Phosphorus: 3.8 mg/dL (ref 2.5–4.6)

## 2018-10-09 MED ORDER — SODIUM CHLORIDE 0.9 % IV SOLN
INTRAVENOUS | Status: DC
  Administered 2018-10-09 – 2018-10-10 (×2): via INTRAVENOUS

## 2018-10-09 MED ORDER — OXYCODONE HCL 5 MG PO TABS: 5.0000 mg | ORAL_TABLET | Freq: Four times a day (QID) | ORAL | 0 refills | Status: AC | PRN

## 2018-10-09 NOTE — Progress Notes (Signed)
Patient's Foley catheter was DC'd at 0651 this morning. Patient voided once with a stool and again at 1300 for 26mls amber, clear urine. Bladder scan shows zero urine in bladder. Dr Dema Severin paged to make him aware. Donne Hazel, RN

## 2018-10-09 NOTE — Evaluation (Signed)
Occupational Therapy Evaluation Patient Details Name: Bradley [name] MRN: [id number] DOB: May 03, 1931 Today's Date: 10/09/2018    History of Present Illness 85 y.o. male admitted with adenocarcinoma, s/p colectomy 10/08/18. PMH: HTN, HLD, CKD, CAD - remote cardiac event - 1999 - underwent cardiac catheter and angioplasty    Clinical Impression   OT education complete.  Pts wife will A as needed.  Pt has needed DME    Follow Up Recommendations  No OT follow up    Equipment Recommendations  None recommended by OT    Recommendations for Other Services       Precautions / Restrictions Precautions Precautions: Other (comment) Precaution Comments: abdominal incision; denies falls in past 1 year Restrictions Weight Bearing Restrictions: No      Mobility Bed Mobility Overal bed mobility: Needs Assistance Bed Mobility: Rolling;Sidelying to Sit;Sit to Sidelying Rolling: Min guard Sidelying to sit: Min assist     Sit to sidelying: Min assist General bed mobility comments: VCs for log roll technique, min A to raise trunk  Transfers Overall transfer level: Needs assistance Equipment used: Rolling walker (2 wheeled) Transfers: Sit to/from Stand Sit to Stand: Min guard;From elevated surface         General transfer comment: VCs hand placement    Balance Overall balance assessment: Modified Independent                                         ADL either performed or assessed with clinical judgement   ADL Overall ADL's : Needs assistance/impaired     Grooming: Set up;Sitting   Upper Body Bathing: Set up;Sitting   Lower Body Bathing: Minimal assistance;Sit to/from stand;Cueing for sequencing;Cueing for safety   Upper Body Dressing : Set up;Sitting   Lower Body Dressing: Minimal assistance;Sit to/from stand;Cueing for sequencing;Cueing for safety   Toilet Transfer: RW;Cueing for safety;Cueing for sequencing;Minimal assistance   Toileting- Clothing  Manipulation and Hygiene: Minimal assistance;Sit to/from stand;Cueing for sequencing;Cueing for safety         General ADL Comments: wife will A as needed with ADL activity     Vision Patient Visual Report: No change from baseline              Pertinent Vitals/Pain Pain Assessment: No/denies pain     Hand Dominance     Extremity/Trunk Assessment Upper Extremity Assessment Upper Extremity Assessment: Generalized weakness   Lower Extremity Assessment Lower Extremity Assessment: Overall WFL for tasks assessed   Cervical / Trunk Assessment Cervical / Trunk Assessment: Normal   Communication Communication Communication: HOH   Cognition Arousal/Alertness: Awake/alert Behavior During Therapy: WFL for tasks assessed/performed Overall Cognitive Status: Within Functional Limits for tasks assessed                                                Home Living Family/patient expects to be discharged to:: Private residence Living Arrangements: Spouse/significant other Available Help at Discharge: Family;Available 24 hours/day Type of Home: House Home Access: Stairs to enter [employer] of Steps: 5 Entrance Stairs-Rails: Right Home Layout: Two level;Able to live on main level with bedroom/bathroom               Home Equipment: None          Prior Functioning/Environment  Level of Independence: Independent                          OT Goals(Current goals can be found in the care plan section) Acute Rehab OT Goals Patient Stated Goal: play golf, go fishing OT Goal Formulation: With patient  OT Frequency:                AM-PAC OT "6 Clicks" Daily Activity     Outcome Measure Help from another person eating meals?: None Help from another person taking care of personal grooming?: None Help from another person toileting, which includes using toliet, bedpan, or urinal?: A Little Help from another person bathing (including  washing, rinsing, drying)?: A Little Help from another person to put on and taking off regular upper body clothing?: None Help from another person to put on and taking off regular lower body clothing?: A Little 6 Click Score: 21   End of Session    Activity Tolerance: Patient limited by fatigue Patient left: in bed;with call bell/phone within reach                   Time: 1200-1217 OT Time Calculation (min): 17 min Charges:  OT General Charges $OT Visit: 1 Visit OT Evaluation $OT Eval Low Complexity: 1 Low Kari Baars, OT Acute Rehabilitation Services [phone] Office- [place], Edwena Felty D 10/09/2018, 1:44 PM

## 2018-10-09 NOTE — Evaluation (Signed)
Physical Therapy Evaluation Patient Details Name: Bradley [name] MRN: [id number] DOB: 04/11/1931 Today's Date: 10/09/2018   History of Present Illness  85 y.o. male admitted with adenocarcinoma, s/p colectomy 10/08/18. PMH: HTN, HLD, CKD, CAD - remote cardiac event - 1999 - underwent cardiac catheter and angioplasty   Clinical Impression  Pt admitted with above diagnosis. Pt currently with functional limitations due to the deficits listed below (see PT Problem List). Pt ambulated 400' with RW, no loss of balance. Instructed pt in log roll technique for supine to sit. Good progress expected. Encouraged pt to ambulate in halls 3-4x/day.  Pt will benefit from skilled PT to increase their independence and safety with mobility to allow discharge to the venue listed below.       Follow Up Recommendations No PT follow up    Equipment Recommendations  Rolling walker with 5" wheels;3in1 (PT)    Recommendations for Other Services       Precautions / Restrictions Precautions Precautions: Other (comment) Precaution Comments: abdominal incision; denies falls in past 1 year Restrictions Weight Bearing Restrictions: No      Mobility  Bed Mobility Overal bed mobility: Needs Assistance Bed Mobility: Rolling;Sidelying to Sit Rolling: Min guard Sidelying to sit: Min assist       General bed mobility comments: VCs for log roll technique, min A to raise trunk  Transfers Overall transfer level: Needs assistance Equipment used: Rolling walker (2 wheeled) Transfers: Sit to/from Stand Sit to Stand: Min guard;From elevated surface         General transfer comment: VCs hand placement  Ambulation/Gait Ambulation/Gait assistance: Supervision Gait Distance (Feet): 400 Feet Assistive device: Rolling walker (2 wheeled) Gait Pattern/deviations: Step-through pattern;Decreased stride length;Trunk flexed Gait velocity: WFL   General Gait Details: VCs to correct flexed posture, no loss of  balance  Stairs            Wheelchair Mobility    Modified Rankin (Stroke Patients Only)       Balance Overall balance assessment: Modified Independent                                           Pertinent Vitals/Pain Pain Assessment: No/denies pain    Home Living Family/patient expects to be discharged to:: Private residence Living Arrangements: Spouse/significant other Available Help at Discharge: Family;Available 24 hours/day Type of Home: House Home Access: Stairs to enter Entrance Stairs-Rails: Right Entrance Stairs-Number of Steps: 5 Home Layout: Two level;Able to live on main level with bedroom/bathroom Home Equipment: None      Prior Function Level of Independence: Independent               Hand Dominance        Extremity/Trunk Assessment   Upper Extremity Assessment Upper Extremity Assessment: Defer to OT evaluation    Lower Extremity Assessment Lower Extremity Assessment: Overall WFL for tasks assessed    Cervical / Trunk Assessment Cervical / Trunk Assessment: Normal  Communication   Communication: HOH  Cognition Arousal/Alertness: Awake/alert Behavior During Therapy: WFL for tasks assessed/performed Overall Cognitive Status: Within Functional Limits for tasks assessed                                        General Comments      Exercises  Assessment/Plan    PT Assessment Patient needs continued PT services  PT Problem List Decreased activity tolerance;Decreased mobility       PT Treatment Interventions Gait training;DME instruction;Patient/family education    PT Goals (Current goals can be found in the Care Plan section)  Acute Rehab PT Goals Patient Stated Goal: play golf, go fishing PT Goal Formulation: With patient/family Time For Goal Achievement: 10/23/18 Potential to Achieve Goals: Good    Frequency Min 3X/week   Barriers to discharge        Co-evaluation                AM-PAC PT "6 Clicks" Mobility  Outcome Measure Help needed turning from your back to your side while in a flat bed without using bedrails?: A Little Help needed moving from lying on your back to sitting on the side of a flat bed without using bedrails?: A Little Help needed moving to and from a bed to a chair (including a wheelchair)?: A Little Help needed standing up from a chair using your arms (e.g., wheelchair or bedside chair)?: A Little Help needed to walk in hospital room?: None Help needed climbing 3-5 steps with a railing? : A Little 6 Click Score: 19    End of Session Equipment Utilized During Treatment: Gait belt Activity Tolerance: Patient tolerated treatment well Patient left: Other (comment);with family/visitor present(in bathroom attempting to urinate, son and wife present) Nurse Communication: Mobility status PT Visit Diagnosis: Difficulty in walking, not elsewhere classified (R26.2)    Time: 6979-4801 PT Time Calculation (min) (ACUTE ONLY): 21 min   Charges:   PT Evaluation $PT Eval Low Complexity: 1 Low          Blondell Reveal Kistler PT 10/09/2018  Acute Rehabilitation Services Pager [phone] Office [phone]

## 2018-10-09 NOTE — Progress Notes (Signed)
Subjective No acute events. Feeling well. Denies n/v. Some bloating. Has been up to chair. Denies BM. Adequate UOP  Objective: Vital signs in last 24 hours: Temp:  [94.3 F (34.6 C)-98.2 F (36.8 C)] 97.5 F (36.4 C) (02/07 0631) Pulse Rate:  [55-80] 65 (02/07 0631) Resp:  [14-18] 16 (02/07 0631) BP: (114-159)/(54-84) 138/57 (02/07 0631) SpO2:  [97 %-100 %] 100 % (02/07 0631) Last BM Date: 10/08/18  Intake/Output from previous day: 02/06 0701 - 02/07 0700 In: 3461.2 [P.O.:120; I.V.:3341.2] Out: 8832 [Urine:915; Blood:50] Intake/Output this shift: No intake/output data recorded.  Gen: NAD, comfortable CV: RRR Pulm: Normal work of breathing Abd: Soft, mildly distended; not significantly ttp; no rebound; no guarding. Incisions c/d/i without erythema Ext: SCDs in place  Lab Results: CBC  Recent Labs    10/09/18 0449  WBC 14.9*  HGB 9.5*  HCT 32.1*  PLT 172   BMET Recent Labs    10/09/18 0449  NA 137  K 5.5*  CL 112*  CO2 17*  GLUCOSE 136*  BUN 26*  CREATININE 2.25*  CALCIUM 7.7*   PT/INR No results for input(s): LABPROT, INR in the last 72 hours. ABG No results for input(s): PHART, HCO3 in the last 72 hours.  Invalid input(s): PCO2, PO2  Studies/Results:  Anti-infectives: Anti-infectives (From admission, onward)   Start     Dose/Rate Route Frequency Ordered Stop   10/08/18 0600  cefoTEtan (CEFOTAN) 2 g in sodium chloride 0.9 % 100 mL IVPB     2 g 200 mL/hr over 30 Minutes Intravenous On call to O.R. 10/08/18 0529 10/08/18 0735       Assessment/Plan: Patient Active Problem List   Diagnosis Date Noted  . Colon cancer ([place]) 10/08/2018   s/p Procedure(s): LAPAROSCOPIC RIGHT HEMI COLECTOMY ERAS PATHWAY 10/08/2018  -K 5.5, will switch to NS MIVF -Clears; advance to fulls tonight if feeling well, distention improving, no n/v -Ambulate 5x/day -D/C Foley -PT/OT -PPx: SQH, SCDs   LOS: 1 day   Sharon Mt. Dema Severin, M.D. [facility]

## 2018-10-10 LAB — CBC
HCT: 35.2 % — ABNORMAL LOW (ref 39.0–52.0)
Hemoglobin: 10.6 g/dL — ABNORMAL LOW (ref 13.0–17.0)
MCH: 26.4 pg (ref 26.0–34.0)
MCHC: 30.1 g/dL (ref 30.0–36.0)
MCV: 87.8 fL (ref 80.0–100.0)
Platelets: 208 10*3/uL (ref 150–400)
RBC: 4.01 MIL/uL — ABNORMAL LOW (ref 4.22–5.81)
RDW: 16 % — ABNORMAL HIGH (ref 11.5–15.5)
WBC: 14.1 10*3/uL — ABNORMAL HIGH (ref 4.0–10.5)
nRBC: 0 % (ref 0.0–0.2)

## 2018-10-10 LAB — BASIC METABOLIC PANEL
Anion gap: 8 (ref 5–15)
BUN: 33 mg/dL — AB (ref 8–23)
CO2: 22 mmol/L (ref 22–32)
Calcium: 8.3 mg/dL — ABNORMAL LOW (ref 8.9–10.3)
Chloride: 108 mmol/L (ref 98–111)
Creatinine, Ser: 2.38 mg/dL — ABNORMAL HIGH (ref 0.61–1.24)
GFR calc Af Amer: 28 mL/min — ABNORMAL LOW (ref >60)
GFR, EST NON AFRICAN AMERICAN: 24 mL/min — AB (ref >60)
Glucose, Bld: 165 mg/dL — ABNORMAL HIGH (ref 70–99)
Potassium: 4.4 mmol/L (ref 3.5–5.1)
Sodium: 138 mmol/L (ref 135–145)

## 2018-10-10 LAB — MAGNESIUM: Magnesium: 2 mg/dL (ref 1.7–2.4)

## 2018-10-10 LAB — GLUCOSE, CAPILLARY
Glucose-Capillary: 154 mg/dL — ABNORMAL HIGH (ref 70–99)
Glucose-Capillary: 168 mg/dL — ABNORMAL HIGH (ref 70–99)
Glucose-Capillary: 178 mg/dL — ABNORMAL HIGH (ref 70–99)
Glucose-Capillary: 182 mg/dL — ABNORMAL HIGH (ref 70–99)

## 2018-10-10 LAB — PHOSPHORUS: Phosphorus: 2.8 mg/dL (ref 2.5–4.6)

## 2018-10-10 MED ORDER — SODIUM CHLORIDE 0.45 % IV SOLN
INTRAVENOUS | Status: DC
  Administered 2018-10-10 – 2018-10-14 (×6): via INTRAVENOUS

## 2018-10-10 NOTE — Progress Notes (Signed)
Patient called complaining about having tightness in his belly, and feeling uncomfortable.  Patient looked flushed, His wife suggested that we check BP and it was 180/80 and pulse 80.  Hydralazine was given.  Will recheck in 30 min to see is it has decreased.  Patient stated he just needed to bring up the gas but could not.  Small amount of brown emesis drained but no flatus.  He got up with assist to walk the hall to help relieve the pressure.  Maalox was also given.   Will continue to monitor.

## 2018-10-10 NOTE — Progress Notes (Signed)
Patient abdomen is tighter and distended this morning.   Some bowel sounds but infrequent.  Was able to vomit about 60 cc emesis.

## 2018-10-10 NOTE — Progress Notes (Signed)
     Assessment & Plan: POD#2 - status post lap right colectomy  Ileus - failed to advance on ERAS - will change to clear liquid diet today  Encouraged OOB, ambulation  Rocking chair to bedside  Rx for nausea, pain as needed        Armandina Gemma, MD       [facility]       Office: [phone]   Chief Complaint: Colon cancer  Subjective: Patient in bed, mild discomfort.  One episode emesis this AM.  Wife at bedside.  Objective: Vital signs in last 24 hours: Temp:  [97.4 F (36.3 C)-98.9 F (37.2 C)] 98.3 F (36.8 C) (02/08 0553) Pulse Rate:  [51-78] 78 (02/08 0553) Resp:  [16-20] 20 (02/08 0553) BP: (127-193)/(54-80) 166/69 (02/08 0553) SpO2:  [93 %-100 %] 94 % (02/08 0553) Weight:  [83.7 kg] 83.7 kg (02/08 0553) Last BM Date: 10/09/18  Intake/Output from previous day: 02/07 0701 - 02/08 0700 In: 2833 [P.O.:600; I.V.:2233] Out: 1135 [Urine:1075; Emesis/NG output:60] Intake/Output this shift: No intake/output data recorded.  Physical Exam: HEENT - sclerae clear, mucous membranes moist Neck - soft; right IJ line in place Chest - clear bilaterally Cor - RRR Abdomen - marked distension, tense; few BS present; dressings dry and intact Ext - no edema, non-tender Neuro - alert & oriented, no focal deficits  Lab Results:  Recent Labs    10/09/18 0449 10/10/18 0429  WBC 14.9* 14.1*  HGB 9.5* 10.6*  HCT 32.1* 35.2*  PLT 172 208   BMET Recent Labs    10/09/18 0449 10/10/18 0429  NA 137 138  K 5.5* 4.4  CL 112* 108  CO2 17* 22  GLUCOSE 136* 165*  BUN 26* 33*  CREATININE 2.25* 2.38*  CALCIUM 7.7* 8.3*   PT/INR No results for input(s): LABPROT, INR in the last 72 hours. Comprehensive Metabolic Panel:    Component Value Date/Time   NA 138 10/10/2018 0429   NA 137 10/09/2018 0449   K 4.4 10/10/2018 0429   K 5.5 (H) 10/09/2018 0449   CL 108 10/10/2018 0429   CL 112 (H) 10/09/2018 0449   CO2 22 10/10/2018 0429   CO2 17 (L)  10/09/2018 0449   BUN 33 (H) 10/10/2018 0429   BUN 26 (H) 10/09/2018 0449   CREATININE 2.38 (H) 10/10/2018 0429   CREATININE 2.25 (H) 10/09/2018 0449   GLUCOSE 165 (H) 10/10/2018 0429   GLUCOSE 136 (H) 10/09/2018 0449   CALCIUM 8.3 (L) 10/10/2018 0429   CALCIUM 7.7 (L) 10/09/2018 0449   AST 16 09/29/2018 1230   AST 15 08/07/2018 1318   ALT 15 09/29/2018 1230   ALT 9 08/07/2018 1318   ALKPHOS 67 09/29/2018 1230   ALKPHOS 64 08/07/2018 1318   BILITOT 0.7 09/29/2018 1230   BILITOT 0.4 08/07/2018 1318   PROT 6.4 (L) 09/29/2018 1230   PROT 6.0 08/07/2018 1318   ALBUMIN 3.8 09/29/2018 1230   ALBUMIN 3.8 08/07/2018 1318    Studies/Results: No results found.    Armandina Gemma 10/10/2018  Patient ID: Jarome Matin, male   DOB: 1930/09/07, 85 y.o.   MRN: [id number]

## 2018-10-10 NOTE — Progress Notes (Signed)
I have reviewed and concur with this student's documentation.   

## 2018-10-11 LAB — BASIC METABOLIC PANEL
Anion gap: 11 (ref 5–15)
BUN: 41 mg/dL — ABNORMAL HIGH (ref 8–23)
CO2: 18 mmol/L — ABNORMAL LOW (ref 22–32)
Calcium: 8.3 mg/dL — ABNORMAL LOW (ref 8.9–10.3)
Chloride: 107 mmol/L (ref 98–111)
Creatinine, Ser: 2.5 mg/dL — ABNORMAL HIGH (ref 0.61–1.24)
GFR, EST AFRICAN AMERICAN: 26 mL/min — AB (ref >60)
GFR, EST NON AFRICAN AMERICAN: 23 mL/min — AB (ref >60)
Glucose, Bld: 182 mg/dL — ABNORMAL HIGH (ref 70–99)
POTASSIUM: 5.6 mmol/L — AB (ref 3.5–5.1)
Sodium: 136 mmol/L (ref 135–145)

## 2018-10-11 LAB — CBC
HCT: 38.9 % — ABNORMAL LOW (ref 39.0–52.0)
Hemoglobin: 11.5 g/dL — ABNORMAL LOW (ref 13.0–17.0)
MCH: 25.8 pg — ABNORMAL LOW (ref 26.0–34.0)
MCHC: 29.6 g/dL — ABNORMAL LOW (ref 30.0–36.0)
MCV: 87.4 fL (ref 80.0–100.0)
Platelets: 308 10*3/uL (ref 150–400)
RBC: 4.45 MIL/uL (ref 4.22–5.81)
RDW: 16.3 % — ABNORMAL HIGH (ref 11.5–15.5)
WBC: 18.7 10*3/uL — ABNORMAL HIGH (ref 4.0–10.5)
nRBC: 0 % (ref 0.0–0.2)

## 2018-10-11 LAB — MAGNESIUM: MAGNESIUM: 2.4 mg/dL (ref 1.7–2.4)

## 2018-10-11 LAB — PHOSPHORUS: PHOSPHORUS: 3.4 mg/dL (ref 2.5–4.6)

## 2018-10-11 LAB — GLUCOSE, CAPILLARY
Glucose-Capillary: 159 mg/dL — ABNORMAL HIGH (ref 70–99)
Glucose-Capillary: 149 mg/dL — ABNORMAL HIGH (ref 70–99)
Glucose-Capillary: 177 mg/dL — ABNORMAL HIGH (ref 70–99)
Glucose-Capillary: 139 mg/dL — ABNORMAL HIGH (ref 70–99)

## 2018-10-11 NOTE — Plan of Care (Signed)
Plan of care reviewed and discussed with the patient and his wife.  Questions answered.

## 2018-10-11 NOTE — Progress Notes (Signed)
     Assessment & Plan: POD#3 - status post lap right colectomy             Ileus - remain on clear liquid diet today - limited po intake             Encouraged OOB, ambulation             Rocking chair to bedside             Rx for nausea, pain as needed  Creatinine rising - 2.5 this AM  Increase IVF  Repeat labs in AM 2/10        Armandina Gemma, MD       [facility]       Office: [phone]   Chief Complaint: Colon cancer  Subjective: Patient in bed, wife at bedside.  Took very little po intake.  No flatus or BM.  Sat in rocking chair which he likes.  Objective: Vital signs in last 24 hours: Temp:  [97.8 F (36.6 C)-99.1 F (37.3 C)] 99.1 F (37.3 C) (02/09 0550) Pulse Rate:  [73-87] 73 (02/09 0550) Resp:  [16-18] 18 (02/09 0550) BP: (150-199)/(67-89) 168/85 (02/09 0550) SpO2:  [95 %-98 %] 98 % (02/09 0550) Last BM Date: 10/09/18  Intake/Output from previous day: 02/08 0701 - 02/09 0700 In: 2208.3 [P.O.:320; I.V.:1888.3] Out: 715 [Urine:715] Intake/Output this shift: Total I/O In: -  Out: 125 [Urine:125]  Physical Exam: HEENT - sclerae clear, mucous membranes moist Neck - soft Chest - clear bilaterally Cor - RRR Abdomen - moderate distension, tense; quiet; wounds dry and intact Neuro - alert & oriented, no focal deficits  Lab Results:  Recent Labs    10/10/18 0429 10/11/18 0509  WBC 14.1* 18.7*  HGB 10.6* 11.5*  HCT 35.2* 38.9*  PLT 208 308   BMET Recent Labs    10/10/18 0429 10/11/18 0509  NA 138 136  K 4.4 5.6*  CL 108 107  CO2 22 18*  GLUCOSE 165* 182*  BUN 33* 41*  CREATININE 2.38* 2.50*  CALCIUM 8.3* 8.3*   PT/INR No results for input(s): LABPROT, INR in the last 72 hours. Comprehensive Metabolic Panel:    Component Value Date/Time   NA 136 10/11/2018 0509   NA 138 10/10/2018 0429   K 5.6 (H) 10/11/2018 0509   K 4.4 10/10/2018 0429   CL 107 10/11/2018 0509   CL 108 10/10/2018 0429   CO2 18 (L) 10/11/2018  0509   CO2 22 10/10/2018 0429   BUN 41 (H) 10/11/2018 0509   BUN 33 (H) 10/10/2018 0429   CREATININE 2.50 (H) 10/11/2018 0509   CREATININE 2.38 (H) 10/10/2018 0429   GLUCOSE 182 (H) 10/11/2018 0509   GLUCOSE 165 (H) 10/10/2018 0429   CALCIUM 8.3 (L) 10/11/2018 0509   CALCIUM 8.3 (L) 10/10/2018 0429   AST 16 09/29/2018 1230   AST 15 08/07/2018 1318   ALT 15 09/29/2018 1230   ALT 9 08/07/2018 1318   ALKPHOS 67 09/29/2018 1230   ALKPHOS 64 08/07/2018 1318   BILITOT 0.7 09/29/2018 1230   BILITOT 0.4 08/07/2018 1318   PROT 6.4 (L) 09/29/2018 1230   PROT 6.0 08/07/2018 1318   ALBUMIN 3.8 09/29/2018 1230   ALBUMIN 3.8 08/07/2018 1318    Studies/Results: No results found.    Armandina Gemma 10/11/2018  Patient ID: Bradley [name], male   DOB: August 20, 1931, 85 y.o.   MRN: [id number]

## 2018-10-12 LAB — MAGNESIUM: Magnesium: 2.3 mg/dL (ref 1.7–2.4)

## 2018-10-12 LAB — GLUCOSE, CAPILLARY
GLUCOSE-CAPILLARY: 127 mg/dL — AB (ref 70–99)
Glucose-Capillary: 128 mg/dL — ABNORMAL HIGH (ref 70–99)
Glucose-Capillary: 151 mg/dL — ABNORMAL HIGH (ref 70–99)
Glucose-Capillary: 167 mg/dL — ABNORMAL HIGH (ref 70–99)

## 2018-10-12 LAB — CBC
HCT: 31.6 % — ABNORMAL LOW (ref 39.0–52.0)
Hemoglobin: 9.7 g/dL — ABNORMAL LOW (ref 13.0–17.0)
MCH: 26.2 pg (ref 26.0–34.0)
MCHC: 30.7 g/dL (ref 30.0–36.0)
MCV: 85.4 fL (ref 80.0–100.0)
Platelets: 273 10*3/uL (ref 150–400)
RBC: 3.7 MIL/uL — ABNORMAL LOW (ref 4.22–5.81)
RDW: 16.2 % — ABNORMAL HIGH (ref 11.5–15.5)
WBC: 13.4 10*3/uL — ABNORMAL HIGH (ref 4.0–10.5)
nRBC: 0 % (ref 0.0–0.2)

## 2018-10-12 LAB — BASIC METABOLIC PANEL
Anion gap: 8 (ref 5–15)
BUN: 46 mg/dL — ABNORMAL HIGH (ref 8–23)
CO2: 20 mmol/L — ABNORMAL LOW (ref 22–32)
Calcium: 7.8 mg/dL — ABNORMAL LOW (ref 8.9–10.3)
Chloride: 110 mmol/L (ref 98–111)
Creatinine, Ser: 2.27 mg/dL — ABNORMAL HIGH (ref 0.61–1.24)
GFR calc Af Amer: 29 mL/min — ABNORMAL LOW (ref >60)
GFR calc non Af Amer: 25 mL/min — ABNORMAL LOW (ref >60)
Glucose, Bld: 137 mg/dL — ABNORMAL HIGH (ref 70–99)
Potassium: 4.6 mmol/L (ref 3.5–5.1)
Sodium: 138 mmol/L (ref 135–145)

## 2018-10-12 LAB — PHOSPHORUS: Phosphorus: 2.6 mg/dL (ref 2.5–4.6)

## 2018-10-12 MED ORDER — HYDRALAZINE HCL 20 MG/ML IJ SOLN
10.0000 mg | INTRAMUSCULAR | Status: DC | PRN
  Administered 2018-10-12 – 2018-10-13 (×6): 10 mg via INTRAVENOUS
  Filled 2018-10-12 (×6): qty 1

## 2018-10-12 MED ORDER — ATENOLOL 50 MG PO TABS
50.0000 mg | ORAL_TABLET | Freq: Every day | ORAL | Status: DC
  Administered 2018-10-12 – 2018-10-13 (×2): 50 mg via ORAL
  Filled 2018-10-12 (×2): qty 1

## 2018-10-12 NOTE — Progress Notes (Signed)
Pt BP 191/81 but PRN hydralazine discontinued. On-Call MD paged, with no reply. Paged again 30 mins later. MD ordered hydralazine 10 mg IV q 2 PRN, for SBP >160.

## 2018-10-12 NOTE — Progress Notes (Signed)
Subjective No acute events. Had 4 BMs since 7pm last night. Distention improving. Denies n/v. Tolerating clears. Ambulating. Pain well controlled  Objective: Vital signs in last 24 hours: Temp:  [98.4 F (36.9 C)-99.2 F (37.3 C)] 99 F (37.2 C) (02/10 0517) Pulse Rate:  [64-78] 75 (02/10 0517) Resp:  [18] 18 (02/10 0517) BP: (174-191)/(76-81) 191/81 (02/10 0517) SpO2:  [97 %] 97 % (02/10 0517) Weight:  [85.9 kg] 85.9 kg (02/10 0517) Last BM Date: 10/09/18  Intake/Output from previous day: 02/09 0701 - 02/10 0700 In: 2649.6 [P.O.:540; I.V.:2109.6] Out: 760 [Urine:760] Intake/Output this shift: No intake/output data recorded.  Gen: NAD, comfortable CV: RRR Pulm: Normal work of breathing Abd: Soft, NT/ND; incisions c/d/i without erythema Ext: SCDs in place  Lab Results: CBC  Recent Labs    10/11/18 0509 10/12/18 0434  WBC 18.7* 13.4*  HGB 11.5* 9.7*  HCT 38.9* 31.6*  PLT 308 273   BMET Recent Labs    10/11/18 0509 10/12/18 0434  NA 136 138  K 5.6* 4.6  CL 107 110  CO2 18* 20*  GLUCOSE 182* 137*  BUN 41* 46*  CREATININE 2.50* 2.27*  CALCIUM 8.3* 7.8*   PT/INR No results for input(s): LABPROT, INR in the last 72 hours. ABG No results for input(s): PHART, HCO3 in the last 72 hours.  Invalid input(s): PCO2, PO2  Studies/Results:  Anti-infectives: Anti-infectives (From admission, onward)   Start     Dose/Rate Route Frequency Ordered Stop   10/08/18 0600  cefoTEtan (CEFOTAN) 2 g in sodium chloride 0.9 % 100 mL IVPB     2 g 200 mL/hr over 30 Minutes Intravenous On call to O.R. 10/08/18 0529 10/08/18 0735       Assessment/Plan: Patient Active Problem List   Diagnosis Date Noted  . Colon cancer ([place]) 10/08/2018   s/p Procedure(s): LAPAROSCOPIC RIGHT HEMI COLECTOMY ERAS PATHWAY 10/08/2018  -Postoperative ileus appears to be resolving - advance to full liquids today -Continue MIVF - Cr back to baseline today; monitor -Increase atenolol back to dose  he was on prior to 07/2018 - 50mg  daily for his HTN -Ambulate 5x/day -PPx: SQH, SCDs   LOS: 4 days   Sharon Mt. Dema Severin, M.D. [facility]

## 2018-10-12 NOTE — Care Management Important Message (Signed)
Important Message  Patient Details  Name: Bradley [name] MRN: [id number] Date of Birth: 02/25/1931   Medicare Important Message Given:  Yes    Kerin Salen 10/12/2018, 10:44 AMImportant Message  Patient Details  Name: Bradley [name] MRN: [id number] Date of Birth: Apr 17, 1931   Medicare Important Message Given:  Yes    Kerin Salen 10/12/2018, 10:44 AM

## 2018-10-12 NOTE — Progress Notes (Signed)
Physical Therapy Treatment Patient Details Name: Bradley [name] MRN: [id number] DOB: Dec 08, 1930 Today's Date: 10/12/2018    History of Present Illness 85 y.o. male admitted with adenocarcinoma, s/p colectomy 10/08/18. PMH: HTN, HLD, CKD, CAD - remote cardiac event - 1999 - underwent cardiac catheter and angioplasty     PT Comments    Patient seen for mobility progression. Making good progress towards goals. Patient with good motivation to ambulate. Instructed patient on seated exercise activities to further increase strength, endurance and activity tolerance. Will continue to follow.     Follow Up Recommendations  No PT follow up     Equipment Recommendations  Rolling walker with 5" wheels;3in1 (PT)    Recommendations for Other Services       Precautions / Restrictions Precautions Precautions: Other (comment) Precaution Comments: abdominal incision; denies falls in past 1 year Restrictions Weight Bearing Restrictions: No    Mobility  Bed Mobility Overal bed mobility: Needs Assistance Bed Mobility: Rolling;Sidelying to Sit Rolling: Min guard Sidelying to sit: Min assist       General bed mobility comments: Min A for trunk management; good use of UE  Transfers Overall transfer level: Needs assistance Equipment used: Rolling walker (2 wheeled) Transfers: Sit to/from Stand Sit to Stand: Min assist         General transfer comment: Min A to power up from bedside; patient preference to pull from RW requiring verbal cueing  Ambulation/Gait Ambulation/Gait assistance: Min guard;Supervision Gait Distance (Feet): (1 lap around unit) Assistive device: Rolling walker (2 wheeled) Gait Pattern/deviations: Step-through pattern;Decreased stride length;Trunk flexed Gait velocity: WFL   General Gait Details: cueing for obstacle navigation; min guard for safety   Stairs             Wheelchair Mobility    Modified Rankin (Stroke Patients Only)       Balance  Overall balance assessment: Mild deficits observed, not formally tested                                          Cognition Arousal/Alertness: Awake/alert Behavior During Therapy: WFL for tasks assessed/performed Overall Cognitive Status: Within Functional Limits for tasks assessed                                        Exercises General Exercises - Lower Extremity Ankle Circles/Pumps: AROM;Both;5 reps Long Arc Quad: AROM;Both;10 reps;Seated Hip Flexion/Marching: AROM;Both;5 reps;Seated    General Comments General comments (skin integrity, edema, etc.): family present and supportive; wife stating patient may have had BM in bed a few hourse ago with PT stressing importance of immedaitely notifying nursing staff to prevent skin breakdown      Pertinent Vitals/Pain Pain Assessment: No/denies pain    Home Living                      Prior Function            PT Goals (current goals can now be found in the care plan section) Acute Rehab PT Goals Patient Stated Goal: play golf, go fishing PT Goal Formulation: With patient/family Time For Goal Achievement: 10/23/18 Potential to Achieve Goals: Good Progress towards PT goals: Progressing toward goals    Frequency    Min 3X/week      PT Plan  Current plan remains appropriate    Co-evaluation              AM-PAC PT "6 Clicks" Mobility   Outcome Measure  Help needed turning from your back to your side while in a flat bed without using bedrails?: A Little Help needed moving from lying on your back to sitting on the side of a flat bed without using bedrails?: A Little Help needed moving to and from a bed to a chair (including a wheelchair)?: A Little Help needed standing up from a chair using your arms (e.g., wheelchair or bedside chair)?: A Little Help needed to walk in hospital room?: A Little Help needed climbing 3-5 steps with a railing? : A Little 6 Click Score: 18     End of Session Equipment Utilized During Treatment: Gait belt Activity Tolerance: Patient tolerated treatment well Patient left: with call bell/phone within reach;with family/visitor present(in rocking chair) Nurse Communication: Mobility status PT Visit Diagnosis: Difficulty in walking, not elsewhere classified (R26.2)     Time: 1410-1433 PT Time Calculation (min) (ACUTE ONLY): 23 min  Charges:  $Gait Training: 8-22 mins $Therapeutic Activity: 8-22 mins                      Lanney Gins, PT, DPT Supplemental Physical Therapist 10/12/18 2:59 PM Pager: [phone] Office: [phone]

## 2018-10-13 LAB — BASIC METABOLIC PANEL
Anion gap: 10 (ref 5–15)
BUN: 42 mg/dL — ABNORMAL HIGH (ref 8–23)
CO2: 18 mmol/L — ABNORMAL LOW (ref 22–32)
Calcium: 7.6 mg/dL — ABNORMAL LOW (ref 8.9–10.3)
Chloride: 107 mmol/L (ref 98–111)
Creatinine, Ser: 2.11 mg/dL — ABNORMAL HIGH (ref 0.61–1.24)
GFR calc Af Amer: 32 mL/min — ABNORMAL LOW (ref >60)
GFR calc non Af Amer: 28 mL/min — ABNORMAL LOW (ref >60)
Glucose, Bld: 155 mg/dL — ABNORMAL HIGH (ref 70–99)
Potassium: 4.1 mmol/L (ref 3.5–5.1)
Sodium: 135 mmol/L (ref 135–145)

## 2018-10-13 LAB — MAGNESIUM: Magnesium: 2 mg/dL (ref 1.7–2.4)

## 2018-10-13 LAB — GLUCOSE, CAPILLARY
Glucose-Capillary: 140 mg/dL — ABNORMAL HIGH (ref 70–99)
Glucose-Capillary: 167 mg/dL — ABNORMAL HIGH (ref 70–99)
Glucose-Capillary: 166 mg/dL — ABNORMAL HIGH (ref 70–99)
Glucose-Capillary: 150 mg/dL — ABNORMAL HIGH (ref 70–99)

## 2018-10-13 LAB — PHOSPHORUS: Phosphorus: 2.4 mg/dL — ABNORMAL LOW (ref 2.5–4.6)

## 2018-10-13 MED ORDER — AMLODIPINE BESYLATE 5 MG PO TABS
5.0000 mg | ORAL_TABLET | Freq: Every day | ORAL | Status: DC
  Administered 2018-10-13 – 2018-10-14 (×2): 5 mg via ORAL
  Filled 2018-10-13 (×2): qty 1

## 2018-10-13 NOTE — Progress Notes (Signed)
Subjective No acute events. Having flatus and BMs. Denies n/v. Tolerating liquid diet; didn't care for the cream soups. Ambulating. Pain well controlled  Objective: Vital signs in last 24 hours: Temp:  [98.8 F (37.1 C)-99.9 F (37.7 C)] 98.8 F (37.1 C) (02/11 0622) Pulse Rate:  [71-81] 71 (02/11 0622) Resp:  [16-18] 18 (02/11 0622) BP: (166-185)/(64-88) 170/73 (02/11 0622) SpO2:  [95 %-97 %] 95 % (02/11 0622) Weight:  [84.4 kg] 84.4 kg (02/11 0644) Last BM Date: 10/12/18  Intake/Output from previous day: 02/10 0701 - 02/11 0700 In: 3198.4 [P.O.:837; I.V.:2361.4] Out: 1425 [AVWUJ:8119] Intake/Output this shift: No intake/output data recorded.  Gen: NAD, comfortable CV: RRR Pulm: Normal work of breathing Abd: Soft, NT/ND; incisions c/d/i without erythema Ext: SCDs in place  Lab Results: CBC  Recent Labs    10/11/18 0509 10/12/18 0434  WBC 18.7* 13.4*  HGB 11.5* 9.7*  HCT 38.9* 31.6*  PLT 308 273   BMET Recent Labs    10/12/18 0434 10/13/18 0441  NA 138 135  K 4.6 4.1  CL 110 107  CO2 20* 18*  GLUCOSE 137* 155*  BUN 46* 42*  CREATININE 2.27* 2.11*  CALCIUM 7.8* 7.6*   PT/INR No results for input(s): LABPROT, INR in the last 72 hours. ABG No results for input(s): PHART, HCO3 in the last 72 hours.  Invalid input(s): PCO2, PO2  Studies/Results:  Anti-infectives: Anti-infectives (From admission, onward)   Start     Dose/Rate Route Frequency Ordered Stop   10/08/18 0600  cefoTEtan (CEFOTAN) 2 g in sodium chloride 0.9 % 100 mL IVPB     2 g 200 mL/hr over 30 Minutes Intravenous On call to O.R. 10/08/18 0529 10/08/18 0735       Assessment/Plan: Patient Active Problem List   Diagnosis Date Noted  . Colon cancer ([place]) 10/08/2018   s/p Procedure(s): LAPAROSCOPIC RIGHT HEMI COLECTOMY ERAS PATHWAY 10/08/2018  -Postoperative ileus resolving; advance to soft diet -Half rate MIVF -Ambulate 5x/day -PPx: SQH, SCDs -Dispo: Home with rolling walker  possibly tomorrow if doing well, tolerating diet   LOS: 5 days   Sharon Mt. Dema Severin, M.D. [facility]

## 2018-10-13 NOTE — Progress Notes (Signed)
ERAS education reinforced. Did patient attend class prior to procedure? Yes [ x  ] No [   ] Discussed: Pain Control [   ] Mobility [ x  ] Diet [   ] Other [   ]  Patient asleep on lounge bed in room. Wife reports has had a rough few days. Feels like he is turning the corner. Solid diet ordered for late lunch. The plan is for patient to mobilize later today. Reports has been walking in hallway and sitting in rocking chair. Feels wiped out after minimal activity. Will follow. Pecolia Ades, RN, BSN Quality Program Coordinator, Enhanced Recovery after Surgery 10/13/18 2:06 PM

## 2018-10-14 LAB — GLUCOSE, CAPILLARY: Glucose-Capillary: 130 mg/dL — ABNORMAL HIGH (ref 70–99)

## 2018-10-14 MED ORDER — AMLODIPINE BESYLATE 5 MG PO TABS: 5.0000 mg | ORAL_TABLET | Freq: Every day | ORAL | 0 refills | Status: DC

## 2018-10-14 NOTE — Discharge Summary (Signed)
Patient ID: Bradley [name] MRN: [id number] DOB/AGE: 05-13-1931 85 y.o.  Admit date: 10/08/2018 Discharge date: 10/14/2018  Discharge Diagnoses Patient Active Problem List   Diagnosis Date Noted  . Colon cancer ([place]) 10/08/2018    Consultants None  Procedures Laparoscopic right hemicolectomy 10/08/2018  Hospital Course: He was admitted postoperatively. He initially had bowel function however subsequently developed a postoperative ileus. This began to resolve 2/10 and his diet was gently advanced. On 10/14/2018, he was tolerating a regular diet, continued having flatus and BMs, pain was well controlled on oral analgesics and he was mobilizing well with assistance of a walker. He was deemed stable for discharge home.  His pathology returned: Colon, segmental resection for tumor, right, terminal ileum - INVASIVE COLORECTAL ADENOCARCINOMA, 7.5 CM. - TUMOR FOCALLY INVOLVES SEROSAL CONNECTIVE TISSUE. - FOCAL SURGICAL MARGIN INVOLVEMENT BY TUMOR. - NINETEEN BENIGN LYMPH NODES (0/19). - SEVEN SEPARATE TUBULAR ADENOMAS. - SUBMUCOSAL LIPOMA.  This was reviewed with him and his wife. We are submitting his case for discussion at our multidisciplinary tumor board to discuss next steps in his care. We discussed NCCN guidelines for colon cancer surveillance as well. All of their questions were answered to their satisfaction and they expressed understanding   Allergies as of 10/14/2018   No Known Allergies     Medication List    TAKE these medications   amLODipine 5 MG tablet Commonly known as:  NORVASC Take 1 tablet (5 mg total) by mouth daily.   aspirin EC 81 MG tablet Take 81 mg by mouth daily.   atenolol 25 MG tablet Commonly known as:  TENORMIN Take 25 mg by mouth at bedtime.   atorvastatin 40 MG tablet Commonly known as:  LIPITOR Take 40 mg by mouth at bedtime.   Lutein 40 MG Caps Take 40 mg by mouth at bedtime.   oxyCODONE 5 MG immediate release tablet Commonly known as:   ROXICODONE Take 1 tablet (5 mg total) by mouth every 6 (six) hours as needed for up to 7 days (acute postop pain not controlled with tylenol).   PRESERVISION AREDS PO Take 1 capsule by mouth 2 (two) times daily.            Durable Medical Equipment  (From admission, onward)         Start     Ordered   10/14/18 0829  For home use only DME Walker rolling  Once    Question:  Patient needs a walker to treat with the following condition  Answer:  Gait difficulty   10/14/18 0831   10/13/18 0818  For home use only DME Walker rolling  Once    Question:  Patient needs a walker to treat with the following condition  Answer:  S/P right hemicolectomy   10/13/18 [facility] Follow up.   Why:  home rolling walker Contact information: [address] [place] [address] [phone]           Tauheed Mcfayden M. Dema Severin, M.D. [facility]

## 2018-10-14 NOTE — Care Management Note (Signed)
Case Management Note  Patient Details  Name: Bradley [name] MRN: [id number] Date of Birth: Apr 28, 1931  Subjective/Objective:  Per [facility] rep Cassie-patient already has [facility] nursing for p/o monitoring,education orders from prior to admission. No further CM needs.                  Action/Plan:dc home w/dme.   Expected Discharge Date:  10/14/18               Expected Discharge Plan:  Home/Self Care  In-House Referral:     Discharge planning Services  CM Consult  Post Acute Care Choice:  Home Health(Active w/[facility] nursing) Choice offered to:     DME Arranged:  Walker rolling DME Agency:  [place]:    [facility] Agency:     Status of Service:  In process, will continue to follow  If discussed at Long Length of Stay Meetings, dates discussed:    Additional Comments:  Dessa Phi, RN 10/14/2018, 9:06 AM

## 2018-10-14 NOTE — Progress Notes (Signed)
Pt was discharged home today. Instructions were reviewed with patient, and questions were answered. Pt was taken to main entrance via wheelchair by NT.  

## 2018-10-14 NOTE — Care Management Note (Signed)
Case Management Note  Patient Details  Name: JARIOUS LYON MRN: [id number] Date of Birth: 14-Mar-1931  Subjective/Objective:Received message from staff nurse ordered for rw-contacted [facility] for rw to be delivered to rm prior d/c. Informed nurse patient already active w/[facility]-she will check if attending will order.                    Action/Plan:dc home w/dme   Expected Discharge Date:  10/14/18               Expected Discharge Plan:  Home/Self Care  In-House Referral:     Discharge planning Services  CM Consult  Post Acute Care Choice:  Home Health(Active w/[facility]) Choice offered to:     DME Arranged:  Walker rolling DME Agency:  [place]:    [facility] Agency:     Status of Service:  In process, will continue to follow  If discussed at Long Length of Stay Meetings, dates discussed:    Additional Comments:  Dessa Phi, RN 10/14/2018, 8:48 AM

## 2018-10-19 ENCOUNTER — Encounter: Payer: Self-pay | Admitting: Licensed Clinical Social Worker

## 2018-10-23 ENCOUNTER — Encounter: Payer: Self-pay | Admitting: Oncology

## 2018-10-30 ENCOUNTER — Telehealth: Payer: Self-pay | Admitting: Oncology

## 2018-10-30 ENCOUNTER — Inpatient Hospital Stay: Payer: Medicare Other | Attending: Oncology | Admitting: Oncology

## 2018-10-30 ENCOUNTER — Other Ambulatory Visit: Payer: Self-pay

## 2018-10-30 VITALS — BP 142/61 | HR 57 | Temp 97.3°F | Resp 18 | Ht 69.0 in | Wt 176.4 lb

## 2018-10-30 DIAGNOSIS — C180 Malignant neoplasm of cecum: Secondary | ICD-10-CM

## 2018-10-30 DIAGNOSIS — D631 Anemia in chronic kidney disease: Secondary | ICD-10-CM

## 2018-10-30 DIAGNOSIS — N189 Chronic kidney disease, unspecified: Secondary | ICD-10-CM

## 2018-10-30 DIAGNOSIS — Z808 Family history of malignant neoplasm of other organs or systems: Secondary | ICD-10-CM

## 2018-10-30 DIAGNOSIS — D630 Anemia in neoplastic disease: Secondary | ICD-10-CM

## 2018-10-30 DIAGNOSIS — C182 Malignant neoplasm of ascending colon: Secondary | ICD-10-CM

## 2018-10-30 DIAGNOSIS — K635 Polyp of colon: Secondary | ICD-10-CM

## 2018-10-30 DIAGNOSIS — Z803 Family history of malignant neoplasm of breast: Secondary | ICD-10-CM

## 2018-10-30 DIAGNOSIS — I2510 Atherosclerotic heart disease of native coronary artery without angina pectoris: Secondary | ICD-10-CM

## 2018-10-30 NOTE — Progress Notes (Addendum)
[name] New Patient Consult   Requesting MD: Bradley [name] 85 y.o.  [birth year]    Reason for Consult: Colon cancer   HPI: Bradley [name] reports a chronic history of mild anemia that have progressed over the past few years.  He was found to have a Hemoccult positive stool.  He was referred to Dr. Lyndel Safe and was taken to a colonoscopy on 08/04/2018.  A fungating mass was found in the cecum.  The mass was biopsied.  The area was tattooed.  4 polyps were found in the transverse colon.  3 of the polyps were removed.  A 6 mm polyp was removed from the proximal sigmoid colon.  The pathology revealed invasive adenocarcinoma at the cecum.  The pelvis from the transverse colon revealed tubular adenomas as did the sigmoid colon polyp.    A CT of the abdomen pelvis on 08/14/2018 revealed no focal liver lesion.  Luminal narrowing was noted at the proximal transverse colon with eccentric wall thickening.  The cecum appeared abnormal with irregular wall thickening and luminal narrowing at the ileocecal valve.  No evidence of obstruction.  No adenopathy. A CT chest on 09/03/2018 revealed no suspicious pulmonary nodules.  No evidence of metastatic disease.  He was referred to Dr. Dema Severin and was taken to the operating room for a laparoscopic right colectomy on 10/08/2018.  A mass was noted in the proximal transverse colon near the hepatic flexure with the tattoo distal to the mass.  The cecum appeared normal.  No evidence of metastatic disease.  The specimen fractured during extraction.  There were adhesions between the colon and liver at the site of a previous cholecystectomy.  The tumor did not appear to involve the gallbladder fossa.  The pathology ([id number]) revealed invasive adenocarcinoma.  Tumor involved the pericolonic connective tissue and focally the serosal surface.  Transmural disruption was noted at the site of the tumor and tumor was present at less than 0.1 cm from the  serosal surface.  The inked margin in this area was involved by tumor.  19 lymph nodes were negative for metastatic carcinoma.  No lymphovascular or perineural invasion.  No tumor deposits.  The tumor returned MSI-stable with no loss of mismatch repair protein expression.  7 separate tubular adenomas and a submucosal lipoma.   Bradley [name] with a postoperative ileus.  He was discharged home on 10/14/2018.  His bowels are functioning.  Past Medical History:  Diagnosis Date  . Anemia    HEME OCCULT POSITIVE   . CAD (coronary artery disease)    THEY FOUND A BLOCKAGE IN 1999 DURNG A CATHERIZATION , NOT ENOUGH TO  NEED A STENT , THEY JUST CLEANED ME OUT   . Cancer [facility])    SKIN CANCER   . Chronic kidney disease   . Colon cancer ([place])   . Colon polyps 03/2004  . Diverticulosis   . 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 Dyspnea    ON EXERTION   . Elevated cholesterol   . High blood pressure     Past Surgical History:  Procedure Laterality Date  . ANGIOPLASTY  1999  . APPENDECTOMY    . CATARACT EXTRACTION, BILATERAL    . CATHERIZATION   1999  . CHOLECYSTECTOMY    . COLONOSCOPY  07/21/2007   adenomatous polyps diverticulosis  . ESOPHAGOGASTRODUODENOSCOPY  02/07/2012   mild gastritis   . LAPAROSCOPIC RIGHT HEMI COLECTOMY Right 10/08/2018   Procedure: LAPAROSCOPIC RIGHT HEMI COLECTOMY ERAS  PATHWAY;  Surgeon: Ileana Roup, MD;  Location: [facility];  Service: General;  Laterality: Right;  . SIGMOIDOSCOPY  07/06/2009   diverticulosis    Medications: Reviewed  Allergies: No Known Allergies  Family history: His sister had breast cancer.  His father had esophagus cancer at age 7 and died at age 75.  Social History:   He lives with his wife in [place].  He is a retired Forensic psychologist.  He does not use cigarettes.  He reports rare alcohol use.  He was transfused in 2013.  No risk factor for HIV or hepatitis.  ROS:   Positives include: Malaise prior to surgery, nocturia, leg edema  following surgery  A complete ROS was otherwise negative.  Physical Exam:  Blood pressure (!) 142/61, pulse (!) 57, temperature (!) 97.3 F (36.3 C), temperature source Oral, resp. rate 18, height '5\' 9"'  (1.753 m), weight 176 lb 6.4 oz (80 kg), SpO2 99 %.  HEENT: Oropharynx without visible mass, neck without mass Lungs: Clear bilaterally Cardiac: Regular rate and rhythm Abdomen: No hepatosplenomegaly, nontender, no mass, healed surgical incisions GU: Testes without mass Vascular: Trace pitting edema at the low leg bilaterally Lymph nodes: No cervical, supraclavicular, axillary, or inguinal nodes Neurologic: Alert and oriented, the motor exam appears intact in the upper and lower extremities bilaterally Skin: No rash Musculoskeletal: Spine tenderness   LAB:  CBC  Lab Results  Component Value Date   WBC 13.4 (H) 10/12/2018   HGB 9.7 (L) 10/12/2018   HCT 31.6 (L) 10/12/2018   MCV 85.4 10/12/2018   PLT 273 10/12/2018   NEUTROABS 4.4 09/29/2018        CMP  Lab Results  Component Value Date   NA 135 10/13/2018   K 4.1 10/13/2018   CL 107 10/13/2018   CO2 18 (L) 10/13/2018   GLUCOSE 155 (H) 10/13/2018   BUN 42 (H) 10/13/2018   CREATININE 2.11 (H) 10/13/2018   CALCIUM 7.6 (L) 10/13/2018   PROT 6.4 (L) 09/29/2018   ALBUMIN 3.8 09/29/2018   AST 16 09/29/2018   ALT 15 09/29/2018   ALKPHOS 67 09/29/2018   BILITOT 0.7 09/29/2018   GFRNONAA 28 (L) 10/13/2018   GFRAA 32 (L) 10/13/2018    08/07/2018: CEA-2.3   Imaging: CT abdomen/pelvis 08/14/2018-images reviewed     Assessment/Plan:   1. Adenocarcinoma of the right colon (hepatic flexure), stage IIb (T4a, N0), status post a right colectomy 10/08/2018   Colonoscopy 08/04/2018- mass at the cecum, multiple polyps, biopsy of the cecal mass confirmed invasive adenocarcinoma, the polyps from the transverse colon and sigmoid colon returned as tubular adenomas  0/19 lymph nodes, no lymphovascular or perineural invasion,  tumor disrupted with tumor less than 0.1 cm from the serosal surface, inked margin involved by tumor  MSS, no loss of mismatch repair protein expression  Normal preoperative CEA  CT abdomen/pelvis 08/14/2018 with irregular wall thickening at the hepatic flexure and irregular wall thickening at the cecum, no evidence of metastatic disease  CT chest 09/03/2018-no evidence of metastatic disease  2.  Chronic renal failure 3.  Anemia secondary to #1 and #2 4.  Coronary artery disease 5.  Multiple polyps-tubular adenomas noted on the 08/04/2018 colonoscopy and the 10/08/2018 right colectomy specimen   Disposition:   Bradley [name] has been diagnosed with colon cancer.  I discussed the prognosis, adjuvant treatment options, and reviewed details of the surgical pathology report with Bradley [name] and his wife.  He has high risk stage II colon cancer based  on the T4 stage.  I will clarify the tumor location and etiology of the tumor "disruption "with Dr. Dema Severin.  It appears the tumor was not perforated in vivo.  I estimate his chance of developing recurrent colon cancer to be in the 20 to 25% range.  We discussed the benefit associated with adjuvant 5-fluorouracil based chemotherapy in this setting.  He has renal failure which may preclude the use of capecitabine.  We discussed observation versus a trial of adjuvant 5-fluorouracil chemotherapy.  He will return for a chemotherapy teaching class and further discussion next week.  We will check the creatinine when he returns next week.  He does not appear to have hereditary non-polyposis colon cancer syndrome, but his family members are at increased risk of developing colon cancer and should undergo appropriate screening.    Betsy Coder, MD  10/30/2018, 4:20 PM  I discussed the case with Dr. Dema Severin.  He reports the tumor was located at the hepatic flexure.  There was no gross involvement of adjacent structures.  The tumor fractured upon removal from the  abdomen.  The positive inked margin is related to the tumor fracture.  He does not recommend adjuvant radiation.

## 2018-10-30 NOTE — Telephone Encounter (Signed)
Gave avs and calendar ° °

## 2018-11-05 ENCOUNTER — Inpatient Hospital Stay: Payer: Medicare Other | Attending: Nurse Practitioner | Admitting: Nurse Practitioner

## 2018-11-05 ENCOUNTER — Other Ambulatory Visit: Payer: Self-pay

## 2018-11-05 ENCOUNTER — Inpatient Hospital Stay: Payer: Medicare Other

## 2018-11-05 ENCOUNTER — Encounter: Payer: Self-pay | Admitting: Nurse Practitioner

## 2018-11-05 VITALS — BP 140/51 | HR 61 | Temp 97.0°F | Resp 19 | Ht 69.0 in | Wt 173.6 lb

## 2018-11-05 DIAGNOSIS — C182 Malignant neoplasm of ascending colon: Secondary | ICD-10-CM

## 2018-11-05 DIAGNOSIS — I2510 Atherosclerotic heart disease of native coronary artery without angina pectoris: Secondary | ICD-10-CM | POA: Diagnosis not present

## 2018-11-05 DIAGNOSIS — D631 Anemia in chronic kidney disease: Secondary | ICD-10-CM | POA: Diagnosis not present

## 2018-11-05 DIAGNOSIS — N189 Chronic kidney disease, unspecified: Secondary | ICD-10-CM | POA: Diagnosis not present

## 2018-11-05 DIAGNOSIS — C180 Malignant neoplasm of cecum: Secondary | ICD-10-CM | POA: Diagnosis not present

## 2018-11-05 DIAGNOSIS — D630 Anemia in neoplastic disease: Secondary | ICD-10-CM | POA: Insufficient documentation

## 2018-11-05 LAB — CBC WITH DIFFERENTIAL (CANCER CENTER ONLY)
Abs Immature Granulocytes: 0.01 10*3/uL (ref 0.00–0.07)
Basophils Absolute: 0 10*3/uL (ref 0.0–0.1)
Basophils Relative: 0 %
Eosinophils Absolute: 0.2 10*3/uL (ref 0.0–0.5)
Eosinophils Relative: 4 %
HCT: 31.9 % — ABNORMAL LOW (ref 39.0–52.0)
HEMOGLOBIN: 9.7 g/dL — AB (ref 13.0–17.0)
Immature Granulocytes: 0 %
Lymphocytes Relative: 33 %
Lymphs Abs: 1.6 10*3/uL (ref 0.7–4.0)
MCH: 25.9 pg — ABNORMAL LOW (ref 26.0–34.0)
MCHC: 30.4 g/dL (ref 30.0–36.0)
MCV: 85.3 fL (ref 80.0–100.0)
MONOS PCT: 11 %
Monocytes Absolute: 0.5 10*3/uL (ref 0.1–1.0)
Neutro Abs: 2.6 10*3/uL (ref 1.7–7.7)
Neutrophils Relative %: 52 %
Platelet Count: 173 10*3/uL (ref 150–400)
RBC: 3.74 MIL/uL — ABNORMAL LOW (ref 4.22–5.81)
RDW: 15.7 % — ABNORMAL HIGH (ref 11.5–15.5)
WBC Count: 4.9 10*3/uL (ref 4.0–10.5)
nRBC: 0 % (ref 0.0–0.2)

## 2018-11-05 LAB — CMP (CANCER CENTER ONLY)
ALK PHOS: 92 U/L (ref 38–126)
ALT: 29 U/L (ref 0–44)
AST: 27 U/L (ref 15–41)
Albumin: 3.2 g/dL — ABNORMAL LOW (ref 3.5–5.0)
Anion gap: 8 (ref 5–15)
BUN: 28 mg/dL — ABNORMAL HIGH (ref 8–23)
CALCIUM: 8.4 mg/dL — AB (ref 8.9–10.3)
CO2: 24 mmol/L (ref 22–32)
Chloride: 109 mmol/L (ref 98–111)
Creatinine: 2.34 mg/dL — ABNORMAL HIGH (ref 0.61–1.24)
GFR, Est AFR Am: 28 mL/min — ABNORMAL LOW (ref >60)
GFR, Estimated: 24 mL/min — ABNORMAL LOW (ref >60)
Glucose, Bld: 130 mg/dL — ABNORMAL HIGH (ref 70–99)
Potassium: 4.7 mmol/L (ref 3.5–5.1)
Sodium: 141 mmol/L (ref 135–145)
Total Bilirubin: 0.4 mg/dL (ref 0.3–1.2)
Total Protein: 6.2 g/dL — ABNORMAL LOW (ref 6.5–8.1)

## 2018-11-05 NOTE — Progress Notes (Addendum)
[facility] OFFICE PROGRESS NOTE   Diagnosis: Colon cancer  INTERVAL HISTORY:   Bradley [name] returns as scheduled.  He overall is feeling well.  Bowels moving regularly.  He denies abdominal pain.  No nausea or vomiting.  He has a good appetite.  Objective:  Vital signs in last 24 hours:  Blood pressure (!) 140/51, pulse 61, temperature (!) 97 F (36.1 C), temperature source Oral, resp. rate 19, height '5\' 9"'$  (1.753 m), weight 173 lb 9.6 oz (78.7 kg), SpO2 100 %.    Resp: Lungs clear bilaterally. Cardio: Regular rate and rhythm. GI: Abdomen soft and nontender.  No hepatomegaly.  Well-healed midline incision. Vascular: No leg edema.  Lab Results:  Lab Results  Component Value Date   WBC 4.9 11/05/2018   HGB 9.7 (L) 11/05/2018   HCT 31.9 (L) 11/05/2018   MCV 85.3 11/05/2018   PLT 173 11/05/2018   NEUTROABS 2.6 11/05/2018    Imaging:  No results found.  Medications: I have reviewed the patient's current medications.  Assessment/Plan: 1. Adenocarcinoma of the right colon (hepatic flexure), stage IIb (T4a, N0), status post a right colectomy 10/08/2018   Colonoscopy 08/04/2018- mass at the cecum, multiple polyps, biopsy of the cecal mass confirmed invasive adenocarcinoma, the polyps from the transverse colon and sigmoid colon returned as tubular adenomas  0/19 lymph nodes, no lymphovascular or perineural invasion, tumor disrupted with tumor less than 0.1 cm from the serosal surface, inked margin involved by tumor (discussed with Dr. Dema Severin.  He reports the tumor was located at the hepatic flexure.  There was no gross involvement of adjacent structures. The tumor fractured upon removal from the abdomen.  The positive inked margin is related to the tumor fracture.  He does not recommend adjuvant radiation).  MSS, no loss of mismatch repair protein expression  Normal preoperative CEA  CT abdomen/pelvis 08/14/2018 with irregular wall thickening at the hepatic flexure  and irregular wall thickening at the cecum, no evidence of metastatic disease  CT chest 09/03/2018-no evidence of metastatic disease  2.  Chronic renal failure 3.  Anemia secondary to #1 and #2 4.  Coronary artery disease 5.  Multiple polyps-tubular adenomas noted on the 08/04/2018 colonoscopy and the 10/08/2018 right colectomy specimen  Disposition: Bradley [name] appears stable.  Dr. Benay Spice discussed the pathology report with Bradley [name] and his wife at today's visit including the conversation with Dr. Dema Severin indicating there was no gross involvement of adjacent structures and the positive inked margin is related to tumor fracture.  Bradley [name] understands he is not a candidate for adjuvant Xeloda due to his renal function.  He is a candidate for 5-fluorouracil which would be given on the [place] regimen.  We reviewed potential toxicities associated with 5-fluorouracil including bone marrow toxicity, nausea, mouth sores, diarrhea, hand-foot syndrome, skin rash, skin hyperpigmentation, increased sensitivity to sun.  He is undecided as to whether or not he would like to proceed with adjuvant chemotherapy.  He is meeting with the [facility] chemotherapy nurse educator today.  He will consider the information and contact us early next week with his decision.  We reviewed the CBC from today.  He has persistent anemia which is likely in part related to iron deficiency.  He will begin ferrous sulfate 325 mg twice daily.  We will schedule follow-up pending his decision on proceeding with adjuvant chemotherapy.  Patient seen with Dr. Benay Spice.    Ned Card ANP/GNP-BC   11/05/2018  3:16 PM  This was  a shared visit with Ned Card.  We discussed adjuvant treatment options with Bradley [name] and his wife.  He is not a candidate for capecitabine secondary to renal failure.  We discussed observation versus weekly 5-FU/leucovorin as per the [place] regimen.  He will attend a chemotherapy teaching class today.  We  reviewed potential toxicities associated with this regimen.  Bradley [name] is unsure whether he will agree to adjuvant chemotherapy.  He will contact us early next week with his decision.  Julieanne Manson, MD  Addendum 11/09/2018- Bradley [name] wife contacted the office.  He has decided against chemotherapy.  He will return for a CEA and follow-up visit in approximately 5 months.

## 2018-11-06 ENCOUNTER — Ambulatory Visit: Payer: Medicare Other | Admitting: Oncology

## 2018-11-09 ENCOUNTER — Telehealth: Payer: Self-pay | Admitting: *Deleted

## 2018-11-09 ENCOUNTER — Other Ambulatory Visit: Payer: Self-pay | Admitting: Nurse Practitioner

## 2018-11-09 DIAGNOSIS — C182 Malignant neoplasm of ascending colon: Secondary | ICD-10-CM

## 2018-11-09 NOTE — Telephone Encounter (Signed)
Wife called to report that patient has decided against IV chemotherapy. MD notified.

## 2018-11-10 ENCOUNTER — Telehealth: Payer: Self-pay | Admitting: Nurse Practitioner

## 2018-11-10 NOTE — Telephone Encounter (Signed)
Scheduled appt per 3/09 sch message - sent reminder letter in the mail with appt date and time

## 2019-02-02 DIAGNOSIS — R001 Bradycardia, unspecified: Secondary | ICD-10-CM | POA: Insufficient documentation

## 2019-02-02 DIAGNOSIS — I2510 Atherosclerotic heart disease of native coronary artery without angina pectoris: Secondary | ICD-10-CM | POA: Insufficient documentation

## 2019-04-12 ENCOUNTER — Inpatient Hospital Stay: Payer: Medicare Other | Attending: Oncology | Admitting: Oncology

## 2019-04-12 ENCOUNTER — Inpatient Hospital Stay: Payer: Medicare Other

## 2019-04-12 ENCOUNTER — Other Ambulatory Visit: Payer: Self-pay

## 2019-04-12 VITALS — BP 164/47 | HR 52 | Temp 99.1°F | Resp 17 | Ht 69.0 in | Wt 169.5 lb

## 2019-04-12 DIAGNOSIS — C180 Malignant neoplasm of cecum: Secondary | ICD-10-CM | POA: Diagnosis not present

## 2019-04-12 DIAGNOSIS — C182 Malignant neoplasm of ascending colon: Secondary | ICD-10-CM | POA: Diagnosis present

## 2019-04-12 DIAGNOSIS — D631 Anemia in chronic kidney disease: Secondary | ICD-10-CM | POA: Diagnosis not present

## 2019-04-12 DIAGNOSIS — N189 Chronic kidney disease, unspecified: Secondary | ICD-10-CM | POA: Insufficient documentation

## 2019-04-12 DIAGNOSIS — D630 Anemia in neoplastic disease: Secondary | ICD-10-CM | POA: Insufficient documentation

## 2019-04-12 NOTE — Patient Instructions (Signed)
Your tumor marker test (CEA) was drawn today. Results will not be available until 04/13/19. A copy will be sent to Dr. Dema Severin and you will be called with the results.

## 2019-04-12 NOTE — Progress Notes (Signed)
  [facility] OFFICE PROGRESS NOTE   Diagnosis: Colon cancer  INTERVAL HISTORY:   Bradley [name] returns as scheduled.  He feels well.  No complaint.  No difficulty with bowel function.  Good appetite.  Objective:  Vital signs in last 24 hours:  Blood pressure (!) 164/47, pulse (!) 52, temperature 99.1 F (37.3 C), temperature source Oral, resp. rate 17, height '5\' 9"'$  (1.753 m), weight 169 lb 8 oz (76.9 kg), SpO2 100 %.   Limited physical examination secondary to distancing with the COVID pandemic HEENT: Neck without mass Lymphatics: No cervical, supraclavicular, axillary, or inguinal nodes GI: No hepatosplenomegaly, no mass, nontender Vascular: No leg edema   Lab Results:  Lab Results  Component Value Date   WBC 4.9 11/05/2018   HGB 9.7 (L) 11/05/2018   HCT 31.9 (L) 11/05/2018   MCV 85.3 11/05/2018   PLT 173 11/05/2018   NEUTROABS 2.6 11/05/2018    CMP  Lab Results  Component Value Date   NA 141 11/05/2018   K 4.7 11/05/2018   CL 109 11/05/2018   CO2 24 11/05/2018   GLUCOSE 130 (H) 11/05/2018   BUN 28 (H) 11/05/2018   CREATININE 2.34 (H) 11/05/2018   CALCIUM 8.4 (L) 11/05/2018   PROT 6.2 (L) 11/05/2018   ALBUMIN 3.2 (L) 11/05/2018   AST 27 11/05/2018   ALT 29 11/05/2018   ALKPHOS 92 11/05/2018   BILITOT 0.4 11/05/2018   GFRNONAA 24 (L) 11/05/2018   GFRAA 28 (L) 11/05/2018    Medications: I have reviewed the patient's current medications.   Assessment/Plan:  1. Adenocarcinoma of the right colon (hepatic flexure), stage IIb (T4a, N0), status post a right colectomy 10/08/2018   Colonoscopy 08/04/2018- mass at the cecum, multiple polyps, biopsy of the cecal mass confirmed invasive adenocarcinoma, the polyps from the transverse colon and sigmoid colon returned as tubular adenomas  0/19 lymph nodes, no lymphovascular or perineural invasion, tumor disrupted with tumor less than 0.1 cm from the serosal surface, inked margin involved by tumor (discussed  with Dr. Dema Severin.  He reports the tumor was located at the hepatic flexure.  There was no gross involvement of adjacent structures. The tumor fractured upon removal from the abdomen.  The positive inked margin is related to the tumor fracture.  He does not recommend adjuvant radiation).  MSS, no loss of mismatch repair protein expression  Normal preoperative CEA  CT abdomen/pelvis 08/14/2018 with irregular wall thickening at the hepatic flexure and irregular wall thickening at the cecum, no evidence of metastatic disease  CT chest 09/03/2018-no evidence of metastatic disease  Patient declined adjuvant chemotherapy  2.  Chronic renal failure 3.  Anemia secondary to #1 and #2 4.  Coronary artery disease 5.  Multiple polyps-tubular adenomas noted on the 08/04/2018 colonoscopy and the 10/08/2018 right colectomy specimen   Disposition: Bradley [name] is in clinical remission from colon cancer.  We will follow-up on the CEA from today.  He will return for office visit and CEA in 6 months.  I discussed the case with his wife by telephone.  Bradley Coder, MD  04/12/2019  3:48 PM

## 2019-04-13 ENCOUNTER — Telehealth: Payer: Self-pay | Admitting: *Deleted

## 2019-04-13 LAB — CEA (IN HOUSE-CHCC): CEA (CHCC-In House): 2.74 ng/mL (ref 0.00–5.00)

## 2019-04-13 NOTE — Telephone Encounter (Signed)
Left VM with CEA results and copy will be faxed to Dr. Dema Severin.

## 2019-04-30 ENCOUNTER — Telehealth: Payer: Self-pay | Admitting: Oncology

## 2019-04-30 NOTE — Telephone Encounter (Signed)
Scheduled appt per 8/24 sch message - pt is aware of appt.

## 2019-05-05 ENCOUNTER — Telehealth: Payer: Self-pay | Admitting: Licensed Clinical Social Worker

## 2019-05-05 NOTE — Telephone Encounter (Signed)
Patient called regarding upcoming [facility] appointment, patient's wife prefers this to be a walk-in visit.

## 2019-05-06 ENCOUNTER — Inpatient Hospital Stay: Payer: Medicare Other | Attending: Oncology | Admitting: Licensed Clinical Social Worker

## 2019-05-06 ENCOUNTER — Inpatient Hospital Stay: Payer: Medicare Other

## 2019-05-06 ENCOUNTER — Other Ambulatory Visit: Payer: Self-pay

## 2019-05-06 ENCOUNTER — Encounter: Payer: Self-pay | Admitting: Licensed Clinical Social Worker

## 2019-05-06 DIAGNOSIS — Z860100 Personal history of colon polyps, unspecified: Secondary | ICD-10-CM

## 2019-05-06 DIAGNOSIS — Z803 Family history of malignant neoplasm of breast: Secondary | ICD-10-CM

## 2019-05-06 DIAGNOSIS — C182 Malignant neoplasm of ascending colon: Secondary | ICD-10-CM

## 2019-05-06 DIAGNOSIS — Z800 Family history of malignant neoplasm of digestive organs: Secondary | ICD-10-CM | POA: Diagnosis not present

## 2019-05-06 NOTE — Progress Notes (Signed)
REFERRING PROVIDER: Ileana Roup, MD [place],  [place] [address]  PRIMARY PROVIDER:  Jackquline Berlin, DO  PRIMARY REASON FOR VISIT:  1. Family history of breast cancer   2. Family history of pancreatic cancer   3. Family history of esophageal cancer   4. Malignant neoplasm of ascending colon ([place])   5. History of colon polyps      HISTORY OF PRESENT ILLNESS:   Bradley [name], a 85 y.o. male, was seen for a [facility] cancer genetics consultation at the request of Dr. Dema Severin due to a personal and family history of cancer, and personal history of polyps.  Bradley [name] presents to clinic today to discuss the possibility of a hereditary predisposition to cancer, genetic testing, and to further clarify his future cancer risks, as well as potential cancer risks for family members.   In 2020, at the age of 74, Bradley [name] was diagnosed with adenocarcinoma of the right colon, MMR IHC intact, MSI stable. The treatment plan included surgery, patient declined adjuvant chemotherapy.   In 2019, his colonoscopy also showed 5 tubular adenomas. His 2020 colectomy also revealed 7 more tubular adenomas. He reports having precancerous polyps in the past as well, but is unsure how many total.   CANCER HISTORY:  Oncology History  Colon cancer ([place])  10/08/2018 Initial Diagnosis   Colon cancer ([place])   10/30/2018 Cancer Staging   Staging form: Colon and Rectum, AJCC 8th Edition - Pathologic: Stage IIB (pT4a, pN0, cM0) - Signed by Ladell Pier, MD on 10/30/2018      Past Medical History:  Diagnosis Date  . Anemia    HEME OCCULT POSITIVE   . CAD (coronary artery disease)    THEY FOUND A BLOCKAGE IN 1999 DURNG A CATHERIZATION , NOT ENOUGH TO  NEED A STENT , THEY JUST CLEANED ME OUT   . [facility])    SKIN CANCER   . Chronic kidney disease   . Colon cancer ([place])   . Colon polyps 03/2004  . Diverticulosis   . 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 Dyspnea    ON EXERTION   .  Elevated cholesterol   . Family history of breast cancer   . Family history of esophageal cancer   . Family history of pancreatic cancer   . High blood pressure   . History of colon polyps     Past Surgical History:  Procedure Laterality Date  . ANGIOPLASTY  1999  . APPENDECTOMY    . CATARACT EXTRACTION, BILATERAL    . CATHERIZATION   1999  . CHOLECYSTECTOMY    . COLONOSCOPY  07/21/2007   adenomatous polyps diverticulosis  . ESOPHAGOGASTRODUODENOSCOPY  02/07/2012   mild gastritis   . LAPAROSCOPIC RIGHT HEMI COLECTOMY Right 10/08/2018   Procedure: LAPAROSCOPIC RIGHT HEMI COLECTOMY ERAS PATHWAY;  Surgeon: Ileana Roup, MD;  Location: [facility];  Service: General;  Laterality: Right;  . SIGMOIDOSCOPY  07/06/2009   diverticulosis    Social History   Socioeconomic History  . Marital status: Married    Spouse name: Not on file  . Number of children: 2  . Years of education: Not on file  . Highest education level: Not on file  Occupational History  . Not on file  Social Needs  . Financial resource strain: Not on file  . Food insecurity    Worry: Not on file    Inability: Not on file  . Transportation needs    Medical:  Not on file    Non-medical: Not on file  Tobacco Use  . Smoking status: Never Smoker  . Smokeless tobacco: Never Used  Substance and Sexual Activity  . Alcohol use: Yes    Comment: wine 1-2 times a month  . Drug use: Never  . Sexual activity: Not on file  Lifestyle  . Physical activity    Days per week: Not on file    Minutes per session: Not on file  . Stress: Not on file  Relationships  . Social Herbalist on phone: Not on file    Gets together: Not on file    Attends religious service: Not on file    Active member of club or organization: Not on file    Attends meetings of clubs or organizations: Not on file    Relationship status: Not on file  Other Topics Concern  . Not on file  Social History Narrative  . Not on file      FAMILY HISTORY:  We obtained a detailed, 4-generation family history.  Significant diagnoses are listed below: Family History  Problem Relation Age of Onset  . Esophageal cancer Father   . High blood pressure Mother   . Kidney failure Mother   . High blood pressure Sister   . Breast cancer Sister        dx < 38   . Pancreatic cancer Cousin   . Cancer Cousin        unk type  . Cancer Cousin        blood cancer  . Colon cancer Neg Hx    Bradley [name] has 2 sons, ages 74 and 61. His oldest son has had a colonoscopy that did not reveal polyps, his younger son has not had a cscope yet. Bradley [name] has one sister, she is living at 63. This sister had breast cancer, unsure of exact age, but less than 51 years old.   Bradley [name] mother died at 38, no history of cancer. Patient had 4 maternal uncles, no cancers, and no cancers in paternal cousins. His paternal grandmother died at 44, he has limited information about paternal grandfather.   Ms. Betts father died at 44 of esophageal cancer. Patient had 1 paternal aunt and 3 paternal uncles. No cancers for his aunt or uncles. His aunt did have 3 sons, all of them died from cancer. For one of these cousins, he is unsure the cancer type. One of the cousins had pancreatic cancer and died at 27, and the other cousin had a blood cancer of some type. No cancers for his paternal grandparents, grandmother died at 23, grandfather died at 48.   Bradley [name] is unaware of previous family history of genetic testing for hereditary cancer risks. Patient's maternal ancestors are of European descent, and paternal ancestors are of European descent. There is no reported Ashkenazi Jewish ancestry. There is no known consanguinity.  GENETIC COUNSELING ASSESSMENT: Bradley [name] is a 85 y.o. male with a personal and family history which is somewhat suggestive of a hereditary cancer syndrome/hereditary polyposis syndrome and predisposition to cancer. We, therefore, discussed and  recommended the following at today's visit.   DISCUSSION: We discussed that polyps in general are common, however, most people have fewer than 5 lifetime polyps.  When an individual has 10 or more polyps we become concerned about an underlying polyposis syndrome.  The most common hereditary polyposis syndromes are caused by problems in the APC and MUTYH genes, however,  more recently, mutations in the [place] and MSH3 genes have been identified in some polyposis families.  Given his family history of breast and pancreatic cancers, we also discussed that 5 - 10% of cancer is hereditary, and that there are genes associated with both these cancer types. We discussed that testing is beneficial for several reasons including  knowing how to follow individuals after completing their treatment, and understand if other family members could be at risk for cancer and allow them to undergo genetic testing.   We reviewed the characteristics, features and inheritance patterns of hereditary cancer syndromes. We also discussed genetic testing, including the appropriate family members to test, the process of testing, insurance coverage and turn-around-time for results. We discussed the implications of a negative, positive and/or variant of uncertain significant result. We recommended Bradley [name] pursue genetic testing for the CustomNext+RNAinsight gene panel.   The CancerNext gene panel offered by [employer] includes sequencing and rearrangement analysis for the following 91 genes: AIP , ALK , APC ? , ATM ? , AXIN2 , BAP1 , BARD1 , BLM , BMPR1A , BRCA1 ? , BRCA2 ? , BRIP1 ? , CASR , CDC73 , CDH1 ? , CDK4 , CDKN1B , CDKN2A , CFTR , CHEK2 ? , CPA1 , CTNNA1 , CTRC , DICER1 , EGFR , EGLN1 , EPCAM , FAM175A , FANCC , FH , FLCN , GALNT12 , GREM1 , HOXB13 , KIF1B , KIT , LZTR1 , MAX , MEN1 , MET , MITF , MLH1 ? , MLH3 , MRE11A , MSH2 ? , MSH3 , MSH6 ? , MUTYH ? , NBN , NF1 ? , NF2 , NTHL1 , PALB2 ? , PALLD , PDGFRA , PHOX2B , PMS2 ? ,  POLD1 , POLE , POT1 , PRKAR1A , PRSS1 , PTCH1 , PTEN ? , RAD50 , RAD51C ? , RAD51D ? , RB1 , RECQL , RET , RINT1 , RPS20 , SDHA , SDHAF2 , SDHB , SDHC , SDHD , SMAD4 , SMARCA4 , SMARCB1 , SMARCE1 , SPINK1 , STK11 , SUFU , TERT , TMEM127 , TP53 ? , TSC1 , TSC2 , VHL , XRCC2. ? - Gene is used for RNA  Based on Bradley [name] personal history of polyps and family history of cancer  he meets medical criteria for genetic testing. Despite that he meets criteria, he may still have an out of pocket cost. We discussed that if his out of pocket cost for testing is over $100, the laboratory will call and confirm whether he wants to proceed with testing.  If the out of pocket cost of testing is less than $100 he will be billed by the genetic testing laboratory.   PLAN: After considering the risks, benefits, and limitations, Bradley [name] provided informed consent to pursue genetic testing and the blood sample was sent to [facility] for analysis of the CustomNext + RNAinsight panel. Results should be available within approximately 2-3 weeks' time, at which point they will be disclosed by telephone to Bradley [name], as will any additional recommendations warranted by these results. Bradley [name] will receive a summary of his genetic counseling visit and a copy of his results once available. This information will also be available in Epic.   Based on Bradley [name] family history, we recommended his paternal relatives and his sister have genetic counseling and testing. Bradley [name] will let us know if we can be of any assistance in coordinating genetic counseling and/or testing for this family member.  Lastly, we encouraged Bradley [name] to remain in contact with cancer genetics annually so that we can continuously update the family history and inform him of any changes in cancer genetics and testing that may be of benefit for this family.   Bradley [name] questions were answered to his satisfaction today. Our contact information was  provided should additional questions or concerns arise. Thank you for the referral and allowing [place] to share in the care of your patient.   Faith Rogue, MS, [facility] Genetic Counselor [email or web address] Phone: [phone]  The patient was seen for a total of 35 minutes in face-to-face genetic counseling.  Drs. Magrinat, Lindi Adie and/or Burr Medico were available for discussion regarding this case.   _______________________________________________________________________ For Office Staff:  Number of people involved in session: 1 Was an Intern/ student involved with case: no

## 2019-05-18 ENCOUNTER — Telehealth: Payer: Self-pay | Admitting: Licensed Clinical Social Worker

## 2019-05-20 ENCOUNTER — Ambulatory Visit: Payer: Self-pay | Admitting: Licensed Clinical Social Worker

## 2019-05-20 ENCOUNTER — Encounter: Payer: Self-pay | Admitting: Licensed Clinical Social Worker

## 2019-05-20 DIAGNOSIS — Z1379 Encounter for other screening for genetic and chromosomal anomalies: Secondary | ICD-10-CM

## 2019-05-20 DIAGNOSIS — C182 Malignant neoplasm of ascending colon: Secondary | ICD-10-CM

## 2019-05-20 DIAGNOSIS — Z803 Family history of malignant neoplasm of breast: Secondary | ICD-10-CM

## 2019-05-20 DIAGNOSIS — Z800 Family history of malignant neoplasm of digestive organs: Secondary | ICD-10-CM

## 2019-05-20 DIAGNOSIS — K635 Polyp of colon: Secondary | ICD-10-CM

## 2019-05-20 NOTE — Telephone Encounter (Addendum)
Revealed negative genetic testing. This normal result is reassuring and indicates that it is unlikely Mr. Ellender cancer is due to a hereditary cause.  It is unlikely that there is an increased risk of another cancer due to a mutation in one of these genes. We have not identified a hereditary cause for his colon polyps. However, genetic testing is not perfect, and cannot definitively rule out a hereditary cause.  It will be important for him to keep in contact with genetics to learn if any additional testing may be needed in the future.

## 2019-05-20 NOTE — Progress Notes (Signed)
HPI:  [name] was previously seen in the [place] [facility] clinic due to a personal history of cancer and polyps and family history of cancer and concerns regarding a hereditary predisposition to cancer. Please refer to our prior cancer genetics clinic note for more information regarding our discussion, assessment and recommendations, at the time. [name] recent genetic test results were disclosed to him, as were recommendations warranted by these results. These results and recommendations are discussed in more detail below.   In 2020, at the age of 85, [name] was diagnosed with adenocarcinoma of the right colon, MMR IHC intact, MSI stable. The treatment plan included surgery, patient declined adjuvant chemotherapy.   In 2019, his colonoscopy also showed 5 tubular adenomas. His 2020 colectomy also revealed 7 more tubular adenomas. He reports having precancerous polyps in the past as well, but is unsure how many total.   CANCER HISTORY:  Oncology History  Colon cancer ([place])  10/08/2018 Initial Diagnosis   Colon cancer ([facility])   10/30/2018 Cancer Staging   Staging form: Colon and Rectum, AJCC 8th Edition - Pathologic: Stage IIB (pT4a, pN0, cM0) - Signed by Ladell Pier, MD on 10/30/2018     FAMILY HISTORY:  We obtained a detailed, 4-generation family history.  Significant diagnoses are listed below: Family History  Problem Relation Age of Onset  . Esophageal cancer Father   . High blood pressure Mother   . Kidney failure Mother   . High blood pressure Sister   . Breast cancer Sister        dx < 7   . Pancreatic cancer Cousin   . Cancer Cousin        unk type  . Cancer Cousin        blood cancer  . Colon cancer Neg Hx    [name] has 2 sons, ages 63 and 47. His oldest son has had a colonoscopy that did not reveal polyps, his younger son has not had a cscope yet. [name] has one sister, she is living at 37. This sister had breast cancer, unsure of exact age, but  less than 47 years old.   [name] mother died at 63, no history of cancer. Patient had 4 maternal uncles, no cancers, and no cancers in paternal cousins. His paternal grandmother died at 52, he has limited information about paternal grandfather.   Ms. Bradley [name] father died at 63 of esophageal cancer. Patient had 1 paternal aunt and 3 paternal uncles. No cancers for his aunt or uncles. His aunt did have 3 sons, all of them died from cancer. For one of these cousins, he is unsure the cancer type. One of the cousins had pancreatic cancer and died at 43, and the other cousin had a blood cancer of some type. No cancers for his paternal grandparents, grandmother died at 34, grandfather died at 44.   [name] is unaware of previous family history of genetic testing for hereditary cancer risks. Patient's maternal ancestors are of European descent, and paternal ancestors are of European descent. There is no reported Ashkenazi Jewish ancestry. There is no known consanguinity.   GENETIC TEST RESULTS: Genetic testing reported out on 05/17/2019 through the [employer] CustomNext-Cancer+RNAinsight cancer panel found no pathogenic mutations. The CustomNext-Cancer + RNAinsight panel  includes sequencing and/or deletion duplication testing of the following 91 genes: AIP, ALK, APC*, ATM*, AXIN2, BAP1, BARD1, BLM, BMPR1A, BRCA1*, BRCA2*, BRIP1*, CDC73, CDH1*, CDK4, CDKN1B, CDKN2A, CHEK2*, CTNNA1, DICER1, FANCC, FH, FLCN, GALNT12,  KIF1B, LZTR1, MAX, MEN1, MET, MLH1*, MRE11A, MSH2*, MSH3, MSH6*, MUTYH*, NBN, NF1*, NF2, NTHL1, PALB2*, PHOX2B, PMS2*, POT1, PRKAR1A, PTCH1, PTEN*, RAD50, RAD51C*, RAD51D*, RB1, RECQL, RET, SDHA, SDHAF2, SDHB, SDHC, SDHD, SMAD4, SMARCA4, SMARCB1, SMARCE1, STK11, SUFU, TMEM127, TP53*, TSC1, TSC2, VHL and XRCC2 (sequencing and deletion/duplication); CASR, CFTR, CPA1, CTRC, EGFR, EGLN1, FAM175A, HOXB13, KIT, MITF, MLH3, PALLD, PDGFRA, POLD1, POLE, PRSS1, RINT1, RPS20, SPINK1 and TERT (sequencing only);  EPCAM and GREM1 (deletion/duplication only). DNA and RNA analyses performed for * genes.  The test report has been scanned into EPIC and is located under the Molecular Pathology section of the Results Review tab.  A portion of the result report is included below for reference.     We discussed with [name] that because current genetic testing is not perfect, it is possible there may be a gene mutation in one of these genes that current testing cannot detect, but that chance is small.  We also discussed, that there could be another gene that has not yet been discovered, or that we have not yet tested, that is responsible for the cancer diagnoses in the family. It is also possible there is a hereditary cause for the cancer in the family that [name] did not inherit and therefore was not identified in his testing.  Therefore, it is important to remain in touch with cancer genetics in the future so that we can continue to offer [name] the most up to date genetic testing.   ADDITIONAL GENETIC TESTING: We discussed with [name] that his genetic testing was fairly extensive.  If there are genes identified to increase cancer risk that can be analyzed in the future, we would be happy to discuss and coordinate this testing at that time.    CANCER SCREENING RECOMMENDATIONS: [name] test result is considered negative (normal).  This means that we have not identified a hereditary cause for his  personal and family history of cancer at this time. Most cancers happen by chance and this negative test suggests that his cancer may fall into this category.    While reassuring, this does not definitively rule out a hereditary predisposition to cancer. It is still possible that there could be genetic mutations that are undetectable by current technology. There could be genetic mutations in genes that have not been tested or identified to increase cancer risk.  Therefore, it is recommended he continue to follow  the cancer management and screening guidelines provided by his  primary healthcare provider.   An individual's cancer risk and medical management are not determined by genetic test results alone. Overall cancer risk assessment incorporates additional factors, including personal medical history, family history, and any available genetic information that may result in a personalized plan for cancer prevention and surveillance  This negative genetic test simply tells [place] that we cannot yet define why [name] has had  an increased number of colorectal polyps.[name] medical management and screening should be based on the prospect that he  will likely form more colon polyps and should undergo colonoscopy screening at intervals determined by his GI providers.   RECOMMENDATIONS FOR FAMILY MEMBERS:  Relatives in this family might be at some increased risk of developing cancer, over the general population risk, simply due to the family history of cancer.  We recommended male relatives in this family have a yearly mammogram beginning at age 53, or 65 years younger than the earliest onset of cancer, an annual clinical breast exam, and perform monthly  breast self-exams. Male relatives in this family should also have a gynecological exam as recommended by their primary provider. All family members should have a colonoscopy by age 11, or as directed by their physicians.   It is also possible there is a hereditary cause for the cancer in [name] family that he did not inherit and therefore was not identified in him.  Based on [name] family history, we recommended his paternal relatives, especially those related to his cousin who had pancreatic cancer,  have genetic counseling and testing. [name] will let us know if we can be of any assistance in coordinating genetic counseling and/or testing for these family members.  FOLLOW-UP: Lastly, we discussed with [name] that cancer genetics is a rapidly  advancing field and it is possible that new genetic tests will be appropriate for him and/or his family members in the future. We encouraged him to remain in contact with cancer genetics on an annual basis so we can update his personal and family histories and let him know of advances in cancer genetics that may benefit this family.   Our contact number was provided. [name] questions were answered to his satisfaction, and he knows he is welcome to call us at anytime with additional questions or concerns.   Faith Rogue, MS, [facility] Genetic Counselor [email or web address] Phone: [phone]

## 2019-08-24 ENCOUNTER — Encounter: Payer: Self-pay | Admitting: Gastroenterology

## 2019-09-06 ENCOUNTER — Encounter: Payer: Self-pay | Admitting: Gastroenterology

## 2019-09-22 ENCOUNTER — Ambulatory Visit (AMBULATORY_SURGERY_CENTER): Payer: Self-pay | Admitting: *Deleted

## 2019-09-22 ENCOUNTER — Other Ambulatory Visit: Payer: Self-pay

## 2019-09-22 VITALS — Temp 96.8°F | Ht 69.0 in | Wt 171.0 lb

## 2019-09-22 DIAGNOSIS — Z01818 Encounter for other preprocedural examination: Secondary | ICD-10-CM

## 2019-09-22 DIAGNOSIS — Z86010 Personal history of colonic polyps: Secondary | ICD-10-CM

## 2019-09-22 DIAGNOSIS — Z85038 Personal history of other malignant neoplasm of large intestine: Secondary | ICD-10-CM

## 2019-09-22 NOTE — Progress Notes (Signed)
Pt is aware that care partner will wait in the car during procedure; if they feel like they will be too hot or cold to wait in the car; they may wait in the 4 th floor lobby. Patient is aware to bring only one care partner. We want them to wear a mask (we do not have any that we can provide them), practice social distancing, and we will check their temperatures when they get here.  I did remind the patient that their care partner needs to stay in the parking lot the entire time and have a cell phone available, we will call them when the pt is ready for discharge. Patient will wear mask into building.  No egg or soy allergy  No home oxygen use or problems with anesthesia  No medications for weight loss taken  emmi information given  covid test 10-01-19 at 9:30 a.m.  Josh Monday CRNA here to assess pt's airway.  His wife states that with his last surgery, the CRNA mentioned her had a small airway and he did have painful swallowing after procedure for several days.  He had to take Tylenol in order to eat.  Ok to [facility] per [facility] Monday CRNA

## 2019-10-01 ENCOUNTER — Ambulatory Visit (INDEPENDENT_AMBULATORY_CARE_PROVIDER_SITE_OTHER): Payer: Medicare Other

## 2019-10-01 ENCOUNTER — Other Ambulatory Visit: Payer: Self-pay

## 2019-10-01 ENCOUNTER — Other Ambulatory Visit: Payer: Self-pay | Admitting: Gastroenterology

## 2019-10-01 DIAGNOSIS — Z1159 Encounter for screening for other viral diseases: Secondary | ICD-10-CM

## 2019-10-04 LAB — SARS CORONAVIRUS 2 (TAT 6-24 HRS): SARS Coronavirus 2: NEGATIVE

## 2019-10-05 ENCOUNTER — Other Ambulatory Visit: Payer: Self-pay

## 2019-10-05 ENCOUNTER — Ambulatory Visit (AMBULATORY_SURGERY_CENTER): Payer: Medicare Other | Admitting: Gastroenterology

## 2019-10-05 VITALS — BP 121/70 | HR 48 | Temp 96.6°F | Resp 14 | Ht 69.0 in | Wt 171.0 lb

## 2019-10-05 DIAGNOSIS — D123 Benign neoplasm of transverse colon: Secondary | ICD-10-CM

## 2019-10-05 DIAGNOSIS — Z85038 Personal history of other malignant neoplasm of large intestine: Secondary | ICD-10-CM

## 2019-10-05 MED ORDER — SODIUM CHLORIDE 0.9 % IV SOLN: 500.0000 mL | Freq: Once | INTRAVENOUS | Status: DC

## 2019-10-05 NOTE — Patient Instructions (Signed)
Thank you for letting us take care of your healthcare needs today.  Please see handouts given to you on POLYPS, DIVERTICULOSIS AND HEMORRHOIDS.     YOU HAD AN ENDOSCOPIC PROCEDURE TODAY AT [facility]:   Refer to the procedure report that was given to you for any specific questions about what was found during the examination.  If the procedure report does not answer your questions, please call your gastroenterologist to clarify.  If you requested that your care partner not be given the details of your procedure findings, then the procedure report has been included in a sealed envelope for you to review at your convenience later.  YOU SHOULD EXPECT: Some feelings of bloating in the abdomen. Passage of more gas than usual.  Walking can help get rid of the air that was put into your GI tract during the procedure and reduce the bloating. If you had a lower endoscopy (such as a colonoscopy or flexible sigmoidoscopy) you may notice spotting of blood in your stool or on the toilet paper. If you underwent a bowel prep for your procedure, you may not have a normal bowel movement for a few days.  Please Note:  You might notice some irritation and congestion in your nose or some drainage.  This is from the oxygen used during your procedure.  There is no need for concern and it should clear up in a day or so.  SYMPTOMS TO REPORT IMMEDIATELY:   Following lower endoscopy (colonoscopy or flexible sigmoidoscopy):  Excessive amounts of blood in the stool  Significant tenderness or worsening of abdominal pains  Swelling of the abdomen that is new, acute  Fever of 100F or higher   Black, tarry-looking stools  For urgent or emergent issues, a gastroenterologist can be reached at any hour by calling [phone].   DIET:  We do recommend a small meal at first, but then you may proceed to your regular diet.  Drink plenty of fluids but you should avoid alcoholic beverages for 24  hours.  ACTIVITY:  You should plan to take it easy for the rest of today and you should NOT DRIVE or use heavy machinery until tomorrow (because of the sedation medicines used during the test).    FOLLOW UP: Our staff will call the number listed on your records 48-72 hours following your procedure to check on you and address any questions or concerns that you may have regarding the information given to you following your procedure. If we do not reach you, we will leave a message.  We will attempt to reach you two times.  During this call, we will ask if you have developed any symptoms of COVID 19. If you develop any symptoms (ie: fever, flu-like symptoms, shortness of breath, cough etc.) before then, please call [phone].  If you test positive for Covid 19 in the 2 weeks post procedure, please call and report this information to [place].    If any biopsies were taken you will be contacted by phone or by letter within the next 1-3 weeks.  Please call us at [phone] if you have not heard about the biopsies in 3 weeks.    SIGNATURES/CONFIDENTIALITY: You and/or your care partner have signed paperwork which will be entered into your electronic medical record.  These signatures attest to the fact that that the information above on your After Visit Summary has been reviewed and is understood.  Full responsibility of the confidentiality of this discharge information lies  with you and/or your care-partner.

## 2019-10-05 NOTE — Progress Notes (Signed)
To PACU, VSS. Report to [email or web address] 

## 2019-10-05 NOTE — Progress Notes (Signed)
Pt's states no medical or surgical changes since previsit or office visit. 

## 2019-10-05 NOTE — Progress Notes (Signed)
Called to room to assist during endoscopic procedure.  Patient ID and intended procedure confirmed with present staff. Received instructions for my participation in the procedure from the performing physician.  

## 2019-10-05 NOTE — Op Note (Signed)
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 The patient's tolerance of                            previous anesthesia was also reviewed. The risks                            and benefits of the procedure and the sedation                            options and risks were discussed with the patient.                            All questions were answered, and informed consent                            was obtained. Prior Anticoagulants: The patient has                            taken no previous anticoagulant or antiplatelet                            agents. ASA Grade Assessment: III - A patient with                            severe systemic disease. After reviewing the risks  and benefits, the patient was deemed in                            satisfactory condition to undergo the procedure.                           After obtaining informed consent, the colonoscope                            was passed under direct vision. Throughout the                            procedure, the patient's blood pressure, pulse, and                            oxygen saturations were monitored continuously. The                            Colonoscope was introduced through the anus and                            advanced to the the ileocolonic anastomosis. The                            colonoscopy was performed without difficulty. The                            patient tolerated the procedure well. The quality                            of the bowel preparation was good. The ileocolic                            anastomosis, neo-TI and rectum were photographed. Scope In: 9:21:20 AM Scope Out: 9:33:24 AM Scope Withdrawal Time: 0 hours 8 minutes 27 seconds  Total Procedure Duration: 0 hours 12 minutes 4 seconds  Findings:                 A 6 mm polyp was found in the mid transverse colon.                            The polyp was sessile. The polyp was removed with a                            cold snare. Resection and retrieval were complete.                           Multiple small and large-mouthed diverticula were                            found in the sigmoid colon and descending colon.                            Some luminal narrowing  consistent with muscular                            hypertrophy. Few diverticula had stool impacted. No                            endoscopic diverticulitis.                           Non-bleeding internal hemorrhoids were found during                            retroflexion. The hemorrhoids were moderate.                           Evidence of right hemicolectomy with ileocolic                            anastomosis. 2 to 3 cm of neoterminal ileum was                            intubated.                           The exam was otherwise without abnormality to the                            anastomosis. No recurrence. Complications:            No immediate complications. Estimated Blood Loss:     Estimated blood loss: none. Impression:               - One 6 mm polyp in the mid transverse colon,                            removed with a cold snare. Resected and  retrieved.                           - Moderate to severe left colonic diverticulosis.                           - Otherwise normal colonoscopy to anastomosis. No                            recurrence. Recommendation:           - Patient has a contact number available for                            emergencies. The signs and symptoms of potential                            delayed complications were discussed with the                            patient. Return to normal activities tomorrow.  Written discharge instructions were provided to the                            patient.                           - Resume previous diet.                           - Continue present medications.                           - Await pathology results.                           - Repeat colonoscopy is not recommended for                            surveillance d/t advanced age.                           - Return to GI clinic PRN.                           - D/W Regino Schultze. Jackquline Denmark, MD 10/05/2019 9:43:32 AM This report has been signed electronically.

## 2019-10-07 ENCOUNTER — Telehealth: Payer: Self-pay

## 2019-10-07 NOTE — Telephone Encounter (Signed)
  Follow up Call-  Call back number 10/05/2019 08/04/2018  Post procedure Call Back phone  # [phone]  Permission to leave phone message Yes Yes  Some recent data might be hidden     Patient questions:  Do you have a fever, pain , or abdominal swelling? No. Pain Score  0 *  Have you tolerated food without any problems? Yes.    Have you been able to return to your normal activities? Yes.    Do you have any questions about your discharge instructions: Diet   No. Medications  No. Follow up visit  No.  Do you have questions or concerns about your Care? No.  Actions: * If pain score is 4 or above: No action needed, pain <4.  1. Have you developed a fever since your procedure? no  2.   Have you had an respiratory symptoms (SOB or cough) since your procedure? no  3.   Have you tested positive for COVID 19 since your procedure no  4.   Have you had any family members/close contacts diagnosed with the COVID 19 since your procedure?  no   If yes to any of these questions please route to Joylene John, RN and Alphonsa Gin, Therapist, sports.

## 2019-10-08 ENCOUNTER — Telehealth: Payer: Self-pay | Admitting: Gastroenterology

## 2019-10-08 NOTE — Telephone Encounter (Signed)
Pt's wife called and stated that on discharge papers there was mention of labs.  She would like clarification.

## 2019-10-08 NOTE — Telephone Encounter (Signed)
Pt states she got her answer and to disregard earlier message.

## 2019-10-10 ENCOUNTER — Encounter: Payer: Self-pay | Admitting: Gastroenterology

## 2019-10-13 ENCOUNTER — Inpatient Hospital Stay: Payer: Medicare Other | Attending: Oncology

## 2019-10-13 ENCOUNTER — Other Ambulatory Visit: Payer: Self-pay

## 2019-10-13 DIAGNOSIS — C182 Malignant neoplasm of ascending colon: Secondary | ICD-10-CM | POA: Diagnosis not present

## 2019-10-13 DIAGNOSIS — C180 Malignant neoplasm of cecum: Secondary | ICD-10-CM | POA: Diagnosis not present

## 2019-10-13 LAB — CEA (IN HOUSE-CHCC): CEA (CHCC-In House): 3.95 ng/mL (ref 0.00–5.00)

## 2019-10-20 ENCOUNTER — Telehealth: Payer: Self-pay | Admitting: Hematology and Oncology

## 2019-10-20 NOTE — Telephone Encounter (Signed)
Per 2/16 sch msg. Pt's wife is aware of new appt date and time.

## 2019-11-08 ENCOUNTER — Other Ambulatory Visit: Payer: Self-pay | Admitting: Surgery

## 2019-11-08 DIAGNOSIS — C189 Malignant neoplasm of colon, unspecified: Secondary | ICD-10-CM

## 2019-11-18 ENCOUNTER — Ambulatory Visit: Payer: Medicare Other | Admitting: Oncology

## 2019-11-19 ENCOUNTER — Ambulatory Visit
Admission: RE | Admit: 2019-11-19 | Discharge: 2019-11-19 | Disposition: A | Discharge status: Home / Self Care | Payer: Medicare Other | Source: Ambulatory Visit | Attending: Surgery | Admitting: Surgery

## 2019-11-19 DIAGNOSIS — C189 Malignant neoplasm of colon, unspecified: Secondary | ICD-10-CM

## 2019-11-23 ENCOUNTER — Inpatient Hospital Stay: Payer: Medicare Other | Attending: Oncology | Admitting: Oncology

## 2019-11-23 ENCOUNTER — Other Ambulatory Visit: Payer: Self-pay

## 2019-11-23 VITALS — BP 160/60 | HR 57 | Temp 98.5°F | Resp 18 | Ht 69.0 in | Wt 170.5 lb

## 2019-11-23 DIAGNOSIS — C180 Malignant neoplasm of cecum: Secondary | ICD-10-CM | POA: Diagnosis present

## 2019-11-23 DIAGNOSIS — C182 Malignant neoplasm of ascending colon: Secondary | ICD-10-CM

## 2019-11-23 NOTE — Progress Notes (Signed)
[place] OFFICE PROGRESS NOTE   Diagnosis: Colon cancer  INTERVAL HISTORY:   [name] returns as scheduled.  He feels well.  No difficulty with bowel function.  Good appetite.  No complaint. He underwent restaging CTs on 11/19/2019.  There was no evidence of metastatic disease. A colonoscopy  Objective:  Vital signs in last 24 hours:  Blood pressure (!) 160/60, pulse (!) 57, temperature 98.5 F (36.9 C), temperature source Temporal, resp. rate 18, height '5\' 9"'  (1.753 m), weight 170 lb 8 oz (77.3 kg), SpO2 99 %.    Limited physical examination secondary to distancing with the Covid pandemic Lymphatics: No cervical, supraclavicular, axillary, or inguinal nodes GI: Nontender, no mass, no hepatosplenomegaly, left inguinal hernia Vascular: No leg edema    Lab Results:  Lab Results  Component Value Date   WBC 4.9 11/05/2018   HGB 9.7 (L) 11/05/2018   HCT 31.9 (L) 11/05/2018   MCV 85.3 11/05/2018   PLT 173 11/05/2018   NEUTROABS 2.6 11/05/2018    CMP  Lab Results  Component Value Date   NA 141 11/05/2018   K 4.7 11/05/2018   CL 109 11/05/2018   CO2 24 11/05/2018   GLUCOSE 130 (H) 11/05/2018   BUN 28 (H) 11/05/2018   CREATININE 2.34 (H) 11/05/2018   CALCIUM 8.4 (L) 11/05/2018   PROT 6.2 (L) 11/05/2018   ALBUMIN 3.2 (L) 11/05/2018   AST 27 11/05/2018   ALT 29 11/05/2018   ALKPHOS 92 11/05/2018   BILITOT 0.4 11/05/2018   GFRNONAA 24 (L) 11/05/2018   GFRAA 28 (L) 11/05/2018    Lab Results  Component Value Date   CEA1 3.95 10/13/2019     Imaging:  CT ABDOMEN PELVIS WO CONTRAST  Result Date: 11/19/2019 CLINICAL DATA:  Follow-up staging for colon cancer, status post colectomy EXAM: CT CHEST, ABDOMEN AND PELVIS WITHOUT CONTRAST TECHNIQUE: Multidetector CT imaging of the chest, abdomen and pelvis was performed following the standard protocol without IV contrast. Oral enteric contrast was administered. COMPARISON:  CT chest, 09/03/2018, CT  abdomen pelvis, 08/14/2018 FINDINGS: CT CHEST FINDINGS Cardiovascular: Aortic atherosclerosis. Mild cardiomegaly. Three-vessel coronary artery calcifications. Trace pericardial effusion. Mediastinum/Nodes: No enlarged mediastinal, hilar, or axillary lymph nodes. Thyroid gland, trachea, and esophagus demonstrate no significant findings. Lungs/Pleura: Lungs are clear. No pleural effusion or pneumothorax. Musculoskeletal: No chest wall mass or suspicious bone lesions identified. CT ABDOMEN PELVIS FINDINGS Hepatobiliary: No focal liver abnormality is seen. Status post cholecystectomy. No biliary dilatation. Pancreas: Unremarkable. No pancreatic ductal dilatation or surrounding inflammatory changes. Spleen: Normal in size without significant abnormality. Adrenals/Urinary Tract: Adrenal glands are unremarkable. Mildly atrophic kidneys bilaterally. Multiple low-attenuation lesions of the kidneys, the largest of which appear to be fluid attenuation simple cysts, others incompletely characterized on noncontrast examination. Bladder is unremarkable. Stomach/Bowel: Stomach is within normal limits. Status post right hemicolectomy. Large intraluminal lipoma of the distal small bowel again noted (series 2, image 89). No evidence of bowel wall thickening, distention, or inflammatory changes. Sigmoid diverticulosis. Vascular/Lymphatic: Aortic atherosclerosis. No enlarged abdominal or pelvic lymph nodes. Reproductive: Prostatomegaly. Other: Fat containing bilateral inguinal hernias. No abdominopelvic ascites. Musculoskeletal: No acute or significant osseous findings. IMPRESSION: 1. Status post right hemicolectomy. No noncontrast evidence of metastatic disease in the chest, abdomen or pelvis. 2. Sigmoid diverticulosis. 3. Prostatomegaly. 4. Coronary artery disease. 5. Trace, nonspecific pericardial effusion, unchanged. 6. Aortic Atherosclerosis (ICD10-I70.0). Electronically Signed   By: Eddie Candle M.D.   On: 11/19/2019 11:02   CT  CHEST WO  CONTRAST  Result Date: 11/19/2019 CLINICAL DATA:  Follow-up staging for colon cancer, status post colectomy EXAM: CT CHEST, ABDOMEN AND PELVIS WITHOUT CONTRAST TECHNIQUE: Multidetector CT imaging of the chest, abdomen and pelvis was performed following the standard protocol without IV contrast. Oral enteric contrast was administered. COMPARISON:  CT chest, 09/03/2018, CT abdomen pelvis, 08/14/2018 FINDINGS: CT CHEST FINDINGS Cardiovascular: Aortic atherosclerosis. Mild cardiomegaly. Three-vessel coronary artery calcifications. Trace pericardial effusion. Mediastinum/Nodes: No enlarged mediastinal, hilar, or axillary lymph nodes. Thyroid gland, trachea, and esophagus demonstrate no significant findings. Lungs/Pleura: Lungs are clear. No pleural effusion or pneumothorax. Musculoskeletal: No chest wall mass or suspicious bone lesions identified. CT ABDOMEN PELVIS FINDINGS Hepatobiliary: No focal liver abnormality is seen. Status post cholecystectomy. No biliary dilatation. Pancreas: Unremarkable. No pancreatic ductal dilatation or surrounding inflammatory changes. Spleen: Normal in size without significant abnormality. Adrenals/Urinary Tract: Adrenal glands are unremarkable. Mildly atrophic kidneys bilaterally. Multiple low-attenuation lesions of the kidneys, the largest of which appear to be fluid attenuation simple cysts, others incompletely characterized on noncontrast examination. Bladder is unremarkable. Stomach/Bowel: Stomach is within normal limits. Status post right hemicolectomy. Large intraluminal lipoma of the distal small bowel again noted (series 2, image 89). No evidence of bowel wall thickening, distention, or inflammatory changes. Sigmoid diverticulosis. Vascular/Lymphatic: Aortic atherosclerosis. No enlarged abdominal or pelvic lymph nodes. Reproductive: Prostatomegaly. Other: Fat containing bilateral inguinal hernias. No abdominopelvic ascites. Musculoskeletal: No acute or significant  osseous findings. IMPRESSION: 1. Status post right hemicolectomy. No noncontrast evidence of metastatic disease in the chest, abdomen or pelvis. 2. Sigmoid diverticulosis. 3. Prostatomegaly. 4. Coronary artery disease. 5. Trace, nonspecific pericardial effusion, unchanged. 6. Aortic Atherosclerosis (ICD10-I70.0). Electronically Signed   By: Eddie Candle M.D.   On: 11/19/2019 11:02    Medications: I have reviewed the patient's current medications.   Assessment/Plan: 1. Adenocarcinoma of the right colon (hepatic flexure), stage IIb (T4a, N0), status post a right colectomy 10/08/2018   Colonoscopy 08/04/2018- mass at the cecum, multiple polyps, biopsy of the cecal mass confirmed invasive adenocarcinoma, the polyps from the transverse colon and sigmoid colon returned as tubular adenomas  0/19 lymph nodes, no lymphovascular or perineural invasion, tumor disrupted with tumor less than 0.1 cm from the serosal surface, inked margin involved by tumor (discussed with Dr. Dema Severin.  He reports the tumor was located at the hepatic flexure.  There was no gross involvement of adjacent structures. The tumor fractured upon removal from the abdomen.  The positive inked margin is related to the tumor fracture.  He does not recommend adjuvant radiation).  MSS, no loss of mismatch repair protein expression  Normal preoperative CEA  CT abdomen/pelvis 08/14/2018 with irregular wall thickening at the hepatic flexure and irregular wall thickening at the cecum, no evidence of metastatic disease  CT chest 09/03/2018-no evidence of metastatic disease  Patient declined adjuvant chemotherapy  CTs 11/19/2019-negative for recurrent disease  2.  Chronic renal failure 3.  Anemia secondary to #1 and #2 4.  Coronary artery disease 5.  Multiple polyps-tubular adenomas noted on the 08/04/2018 colonoscopy and the 10/08/2018 right colectomy specimen  Colonoscopy 10/05/2019-tubular adenoma removed from the transverse  colon   Disposition: [name] is in remission from colon cancer.  He will return for an office visit and CEA in 6 months.  He has received both doses of the COVID-19 vaccine.  Betsy Coder, MD  11/23/2019  8:38 AM

## 2019-11-24 ENCOUNTER — Telehealth: Payer: Self-pay | Admitting: Oncology

## 2019-11-24 NOTE — Telephone Encounter (Signed)
Scheduled per los. Called and left msg. Mailed printout  °

## 2020-05-12 ENCOUNTER — Telehealth: Payer: Self-pay | Admitting: *Deleted

## 2020-05-12 NOTE — Telephone Encounter (Signed)
Asking if patient was scheduled for lab this week? Dr. Dema Severin thought he was. Confirmed he had lab on 9/23, but he was also supposed to see MD, however MD has no opening on 9/23. Appointments moved to 05/26/20 and wife agrees. Asked if MD will be ordering anything besides CEA and she was informed no, this is all that is needed.

## 2020-05-25 ENCOUNTER — Other Ambulatory Visit: Payer: Medicare Other

## 2020-05-26 ENCOUNTER — Telehealth: Payer: Self-pay | Admitting: *Deleted

## 2020-05-26 ENCOUNTER — Inpatient Hospital Stay: Payer: Medicare Other | Attending: Oncology | Admitting: Oncology

## 2020-05-26 ENCOUNTER — Other Ambulatory Visit: Payer: Self-pay | Admitting: *Deleted

## 2020-05-26 ENCOUNTER — Other Ambulatory Visit: Payer: Self-pay

## 2020-05-26 ENCOUNTER — Inpatient Hospital Stay: Payer: Medicare Other

## 2020-05-26 ENCOUNTER — Telehealth: Payer: Self-pay | Admitting: Oncology

## 2020-05-26 VITALS — BP 125/52 | HR 54 | Temp 97.9°F | Resp 16 | Ht 69.0 in | Wt 166.6 lb

## 2020-05-26 DIAGNOSIS — C182 Malignant neoplasm of ascending colon: Secondary | ICD-10-CM

## 2020-05-26 DIAGNOSIS — Z79899 Other long term (current) drug therapy: Secondary | ICD-10-CM | POA: Insufficient documentation

## 2020-05-26 DIAGNOSIS — R5383 Other fatigue: Secondary | ICD-10-CM | POA: Diagnosis not present

## 2020-05-26 DIAGNOSIS — R970 Elevated carcinoembryonic antigen [CEA]: Secondary | ICD-10-CM | POA: Diagnosis not present

## 2020-05-26 DIAGNOSIS — R5381 Other malaise: Secondary | ICD-10-CM | POA: Diagnosis not present

## 2020-05-26 LAB — TSH: TSH: 0.927 u[IU]/mL (ref 0.320–4.118)

## 2020-05-26 LAB — CBC WITH DIFFERENTIAL (CANCER CENTER ONLY)
Abs Immature Granulocytes: 0.02 10*3/uL (ref 0.00–0.07)
Basophils Absolute: 0 10*3/uL (ref 0.0–0.1)
Basophils Relative: 1 %
Eosinophils Absolute: 0.2 10*3/uL (ref 0.0–0.5)
Eosinophils Relative: 4 %
HCT: 30.1 % — ABNORMAL LOW (ref 39.0–52.0)
Hemoglobin: 8.9 g/dL — ABNORMAL LOW (ref 13.0–17.0)
Immature Granulocytes: 0 %
Lymphocytes Relative: 26 %
Lymphs Abs: 1.6 10*3/uL (ref 0.7–4.0)
MCH: 24.9 pg — ABNORMAL LOW (ref 26.0–34.0)
MCHC: 29.6 g/dL — ABNORMAL LOW (ref 30.0–36.0)
MCV: 84.3 fL (ref 80.0–100.0)
Monocytes Absolute: 0.5 10*3/uL (ref 0.1–1.0)
Monocytes Relative: 8 %
Neutro Abs: 3.8 10*3/uL (ref 1.7–7.7)
Neutrophils Relative %: 61 %
Platelet Count: 209 10*3/uL (ref 150–400)
RBC: 3.57 MIL/uL — ABNORMAL LOW (ref 4.22–5.81)
RDW: 14.2 % (ref 11.5–15.5)
WBC Count: 6.1 10*3/uL (ref 4.0–10.5)
nRBC: 0 % (ref 0.0–0.2)

## 2020-05-26 LAB — CMP (CANCER CENTER ONLY)
ALT: 27 U/L (ref 0–44)
AST: 22 U/L (ref 15–41)
Albumin: 3.3 g/dL — ABNORMAL LOW (ref 3.5–5.0)
Alkaline Phosphatase: 65 U/L (ref 38–126)
Anion gap: 6 (ref 5–15)
BUN: 41 mg/dL — ABNORMAL HIGH (ref 8–23)
CO2: 26 mmol/L (ref 22–32)
Calcium: 8.4 mg/dL — ABNORMAL LOW (ref 8.9–10.3)
Chloride: 109 mmol/L (ref 98–111)
Creatinine: 2.52 mg/dL — ABNORMAL HIGH (ref 0.61–1.24)
GFR, Est AFR Am: 26 mL/min — ABNORMAL LOW (ref >60)
GFR, Estimated: 22 mL/min — ABNORMAL LOW (ref >60)
Glucose, Bld: 120 mg/dL — ABNORMAL HIGH (ref 70–99)
Potassium: 4.7 mmol/L (ref 3.5–5.1)
Sodium: 141 mmol/L (ref 135–145)
Total Bilirubin: 0.4 mg/dL (ref 0.3–1.2)
Total Protein: 6.4 g/dL — ABNORMAL LOW (ref 6.5–8.1)

## 2020-05-26 LAB — CEA (IN HOUSE-CHCC): CEA (CHCC-In House): 15.83 ng/mL — ABNORMAL HIGH (ref 0.00–5.00)

## 2020-05-26 LAB — SAMPLE TO BLOOD BANK

## 2020-05-26 LAB — RETIC PANEL
Immature Retic Fract: 16.4 % — ABNORMAL HIGH (ref 2.3–15.9)
RBC.: 3.57 MIL/uL — ABNORMAL LOW (ref 4.22–5.81)
Retic Count, Absolute: 42.4 10*3/uL (ref 19.0–186.0)
Retic Ct Pct: 1.2 % (ref 0.4–3.1)
Reticulocyte Hemoglobin: 23.1 pg — ABNORMAL LOW (ref >27.9)

## 2020-05-26 LAB — SAVE SMEAR(SSMR), FOR PROVIDER SLIDE REVIEW

## 2020-05-26 NOTE — Telephone Encounter (Signed)
Scheduled appointments per 9/24 los. Gave patient calendar print out with appointment date and time on it.

## 2020-05-26 NOTE — Telephone Encounter (Signed)
Left VM for wife to call to go over his lab results and which physician did she want them forwarded to.

## 2020-05-26 NOTE — Progress Notes (Signed)
Patient reports he has had #3 COVID vaccines. Did not bring card to document.

## 2020-05-26 NOTE — Progress Notes (Signed)
  [facility] OFFICE PROGRESS NOTE   Diagnosis: Colon cancer  INTERVAL HISTORY:   Bradley [name] returns for a scheduled visit.  He is here with his wife.  He has malaise and has lost weight recently.  No anorexia.  No difficulty with bowel function.  No pain.  He has received the COVID-19 booster vaccine.  Objective:  Vital signs in last 24 hours:  Blood pressure (!) 125/52, pulse (!) 54, temperature 97.9 F (36.6 C), temperature source Tympanic, resp. rate 16, height $RemoveBe'5\' 9"'[id number]$  (1.753 m), weight 166 lb 9.6 oz (75.6 kg), SpO2 100 %. HEENT: The conjunctivae are pale Lymphatics: No cervical, supraclavicular, axillary, or inguinal nodes Resp: Lungs clear bilaterally Cardio: Regular rate and rhythm GI: Nontender, no mass, no hepatosplenomegaly Vascular: No leg edema     Lab Results:   Lab Results  Component Value Date   CEA1 15.83 (H) 05/26/2020     Medications: I have reviewed the patient's current medications.   Assessment/Plan: 1. Adenocarcinoma of the right colon (hepatic flexure), stage IIb (T4a, N0), status post a right colectomy 10/08/2018   Colonoscopy 08/04/2018- mass at the cecum, multiple polyps, biopsy of the cecal mass confirmed invasive adenocarcinoma, the polyps from the transverse colon and sigmoid colon returned as tubular adenomas  0/19 lymph nodes, no lymphovascular or perineural invasion, tumor disrupted with tumor less than 0.1 cm from the serosal surface, inked margin involved by tumor (discussed with Dr. Dema Severin.  He reports the tumor was located at the hepatic flexure.  There was no gross involvement of adjacent structures. The tumor fractured upon removal from the abdomen.  The positive inked margin is related to the tumor fracture.  He does not recommend adjuvant radiation).  MSS, no loss of mismatch repair protein expression  Normal preoperative CEA  CT abdomen/pelvis 08/14/2018 with irregular wall thickening at the hepatic flexure and irregular  wall thickening at the cecum, no evidence of metastatic disease  CT chest 09/03/2018-no evidence of metastatic disease  Patient declined adjuvant chemotherapy  CTs 11/19/2019-negative for recurrent disease  CEA elevated 05/26/2020  2.  Chronic renal failure 3.  Anemia secondary to #1 and #2 4.  Coronary artery disease 5.  Multiple polyps-tubular adenomas noted on the 08/04/2018 colonoscopy and the 10/08/2018 right colectomy specimen  Colonoscopy 10/05/2019-tubular adenoma removed from the transverse colon    Disposition: Bradley [name] has developed malaise and has lost a few pounds compared to when he was here 6 months ago.  The CEA is elevated.  He will return to the lab today for a CBC, TSH, and chemistry panel.  He will return for a repeat CEA in 2-3 weeks.  We will plan for restaging CTs if the CEA remains elevated.  We will initiate further diagnostic evaluation if today's CBC reveals anemia.  Betsy Coder, MD  05/26/2020  9:57 AM

## 2020-05-29 LAB — FERRITIN: Ferritin: 17 ng/mL — ABNORMAL LOW (ref 24–336)

## 2020-05-30 ENCOUNTER — Telehealth: Payer: Self-pay | Admitting: *Deleted

## 2020-05-30 MED ORDER — FERROUS SULFATE 325 (65 FE) MG PO TBEC: 325.0000 mg | DELAYED_RELEASE_TABLET | Freq: Two times a day (BID) | ORAL | 3 refills | Status: DC

## 2020-05-30 NOTE — Telephone Encounter (Signed)
Notified of low ferritin result and to begin ferrous sulfate 325 mg OTC bid. They agree to plan and will return as scheduled on 06/19/20.

## 2020-05-30 NOTE — Telephone Encounter (Signed)
-----   Message from Bradley Pier, MD sent at 05/29/2020  1:30 PM EDT ----- Please call patient, iron level and hemoglobin are low, start ferrous sulfate 325 mg twice daily, schedule office visit with Benay Spice or Lattie Haw when he returns for lab in 2-3 weeks

## 2020-06-01 ENCOUNTER — Other Ambulatory Visit: Payer: Self-pay | Admitting: *Deleted

## 2020-06-01 DIAGNOSIS — C182 Malignant neoplasm of ascending colon: Secondary | ICD-10-CM

## 2020-06-01 NOTE — Progress Notes (Signed)
Per Dr. Benay Spice: Needs CBC as well on 10/18

## 2020-06-05 ENCOUNTER — Telehealth: Payer: Self-pay | Admitting: Nurse Practitioner

## 2020-06-05 NOTE — Telephone Encounter (Signed)
Rescheduled appointments times. Spoke to patient's wife who is aware of updated appointment times.

## 2020-06-15 ENCOUNTER — Telehealth: Payer: Self-pay | Admitting: *Deleted

## 2020-06-15 NOTE — Telephone Encounter (Signed)
Wife calling to report that the ferrous sulfate caused Ruari diarrhea and he does not want to take anymore. Asking what else can help his anemia? Asking if he can have flu vaccine when here Monday? Informed her we can give the flu vaccine here Monday and will discuss the management of his anemia on Monday.

## 2020-06-19 ENCOUNTER — Inpatient Hospital Stay: Payer: Medicare Other | Attending: Oncology

## 2020-06-19 ENCOUNTER — Inpatient Hospital Stay (HOSPITAL_BASED_OUTPATIENT_CLINIC_OR_DEPARTMENT_OTHER): Payer: Medicare Other | Admitting: Nurse Practitioner

## 2020-06-19 ENCOUNTER — Other Ambulatory Visit: Payer: Medicare Other

## 2020-06-19 ENCOUNTER — Other Ambulatory Visit: Payer: Self-pay

## 2020-06-19 ENCOUNTER — Ambulatory Visit: Payer: Medicare Other | Admitting: Nurse Practitioner

## 2020-06-19 ENCOUNTER — Encounter: Payer: Self-pay | Admitting: Nurse Practitioner

## 2020-06-19 VITALS — BP 139/44 | HR 65 | Temp 98.4°F | Resp 18 | Ht 69.0 in | Wt 162.0 lb

## 2020-06-19 DIAGNOSIS — R970 Elevated carcinoembryonic antigen [CEA]: Secondary | ICD-10-CM | POA: Insufficient documentation

## 2020-06-19 DIAGNOSIS — C182 Malignant neoplasm of ascending colon: Secondary | ICD-10-CM | POA: Insufficient documentation

## 2020-06-19 DIAGNOSIS — Z23 Encounter for immunization: Secondary | ICD-10-CM | POA: Insufficient documentation

## 2020-06-19 DIAGNOSIS — D509 Iron deficiency anemia, unspecified: Secondary | ICD-10-CM | POA: Diagnosis not present

## 2020-06-19 LAB — CEA (IN HOUSE-CHCC): CEA (CHCC-In House): 18.54 ng/mL — ABNORMAL HIGH (ref 0.00–5.00)

## 2020-06-19 LAB — CBC WITH DIFFERENTIAL (CANCER CENTER ONLY)
Abs Immature Granulocytes: 0.07 10*3/uL (ref 0.00–0.07)
Basophils Absolute: 0 10*3/uL (ref 0.0–0.1)
Basophils Relative: 0 %
Eosinophils Absolute: 0.2 10*3/uL (ref 0.0–0.5)
Eosinophils Relative: 2 %
HCT: 28.1 % — ABNORMAL LOW (ref 39.0–52.0)
Hemoglobin: 8.5 g/dL — ABNORMAL LOW (ref 13.0–17.0)
Immature Granulocytes: 1 %
Lymphocytes Relative: 18 %
Lymphs Abs: 1.4 10*3/uL (ref 0.7–4.0)
MCH: 23.5 pg — ABNORMAL LOW (ref 26.0–34.0)
MCHC: 30.2 g/dL (ref 30.0–36.0)
MCV: 77.8 fL — ABNORMAL LOW (ref 80.0–100.0)
Monocytes Absolute: 0.7 10*3/uL (ref 0.1–1.0)
Monocytes Relative: 9 %
Neutro Abs: 5.4 10*3/uL (ref 1.7–7.7)
Neutrophils Relative %: 70 %
Platelet Count: 299 10*3/uL (ref 150–400)
RBC: 3.61 MIL/uL — ABNORMAL LOW (ref 4.22–5.81)
RDW: 14.6 % (ref 11.5–15.5)
WBC Count: 7.7 10*3/uL (ref 4.0–10.5)
nRBC: 0 % (ref 0.0–0.2)

## 2020-06-19 MED ORDER — INFLUENZA VAC A&B SA ADJ QUAD 0.5 ML IM PRSY
PREFILLED_SYRINGE | INTRAMUSCULAR | Status: AC
  Filled 2020-06-19: qty 0.5

## 2020-06-19 MED ORDER — INFLUENZA VAC A&B SA ADJ QUAD 0.5 ML IM PRSY
0.5000 mL | PREFILLED_SYRINGE | Freq: Once | INTRAMUSCULAR | Status: AC
  Administered 2020-06-19: 0.5 mL via INTRAMUSCULAR

## 2020-06-19 NOTE — Progress Notes (Signed)
[facility] OFFICE PROGRESS NOTE   Diagnosis: Colon cancer  INTERVAL HISTORY:   Bradley [name] returns as scheduled.  He was unable to tolerate oral iron.  He developed diarrhea.  Continued poor energy level.  He thinks energy level is some better.  His wife disagrees.  She notices dyspnea on exertion.  He denies chest pain.  He is not aware of any bleeding.  Objective:  Vital signs in last 24 hours:  Blood pressure (!) 139/44, pulse 65, temperature 98.4 F (36.9 C), temperature source Tympanic, resp. rate 18, height _0  (1.753 m), weight 162 lb (73.5 kg), SpO2 100 %.    HEENT: Conjunctival pallor. Resp: Lungs clear bilaterally. Cardio: Regular rate and rhythm. GI: Abdomen soft and nontender.  No hepatomegaly. Vascular: No leg edema.    Lab Results:  Lab Results  Component Value Date   WBC 7.7 06/19/2020   HGB 8.5 (L) 06/19/2020   HCT 28.1 (L) 06/19/2020   MCV 77.8 (L) 06/19/2020   PLT 299 06/19/2020   NEUTROABS 5.4 06/19/2020    Imaging:  No results found.  Medications: I have reviewed the patient's current medications.  Assessment/Plan: 1. Adenocarcinoma of the right colon(hepatic flexure), stage IIb (T4a, N0), status post a right colectomy 10/08/2018  Colonoscopy 08/04/2018-mass at the cecum, multiple polyps, biopsy of the cecal mass confirmed invasive adenocarcinoma, the polyps from the transverse colon and sigmoid colon returned as tubular adenomas  0/19 lymph nodes, no lymphovascular or perineural invasion, tumor disrupted with tumor less than 0.1 cm from the serosal surface, inked margin involved by tumor (discussed with Dr. Dema Severin. He reports the tumor was located at the hepatic flexure. There was no gross involvement of adjacent structures. The tumor fractured upon removal from the abdomen. The positive inked margin is related to the tumor fracture. He does not recommend adjuvant radiation).  MSS, no loss of mismatch repair protein  expression  Normal preoperative CEA  CT abdomen/pelvis 08/14/2018 with irregular wall thickening at the hepatic flexure and irregular wall thickening at the cecum, no evidence of metastatic disease  CT chest 09/03/2018-no evidence of metastatic disease  Patient declined adjuvant chemotherapy  CTs 11/19/2019-negative for recurrent disease  CEA elevated 05/26/2020  2.Chronic renal failure 3.Anemia secondary to #1 and #2 4.Coronary artery disease 5.Multiple polyps-tubular adenomas noted on the 08/04/2018 colonoscopy and the 10/08/2018 right colectomy specimen  Colonoscopy 10/05/2019-tubular adenoma removed from the transverse colon  Disposition: Bradley [name] performance status seems to be declining.  He has progressive anemia, now microcytic.  He was unable to tolerate oral iron due to diarrhea.  We discussed IV iron. He and his wife understand the chance for an allergic reaction.  He agrees to proceed.  He is symptomatic from the anemia.  We discussed a blood transfusion.  We reviewed potential risks including an allergic reaction, infection.  He agrees to proceed.  We will make arrangements for the blood transfusion later this week.  He will complete a set of stool cards.  We discussed the CEA result from 05/26/2020.  If the CEA is higher today we are referring him for CT scans.  He will return for follow-up 06/28/2020.  We are available to see him sooner if needed.    Ned Card ANP/GNP-BC   06/19/2020  2:16 PM  This was a shared visit with Ned Card.  Bradley [name] has progressive anemia with iron deficiency.  He cannot tolerate oral iron.  He agrees to a trial of IV iron.  He  will receive a red cell transfusion this week.  The CEA remains elevated.  He will be referred for restaging CT scans and return for an office visit next week.  Bradley Manson, MD

## 2020-06-21 ENCOUNTER — Other Ambulatory Visit: Payer: Self-pay | Admitting: Nurse Practitioner

## 2020-06-21 ENCOUNTER — Telehealth: Payer: Self-pay | Admitting: *Deleted

## 2020-06-21 DIAGNOSIS — C182 Malignant neoplasm of ascending colon: Secondary | ICD-10-CM

## 2020-06-21 NOTE — Telephone Encounter (Signed)
Wife called requesting CEA result of 06/19/20. She was provided the result.

## 2020-06-22 ENCOUNTER — Telehealth: Payer: Self-pay | Admitting: *Deleted

## 2020-06-22 ENCOUNTER — Other Ambulatory Visit: Payer: Self-pay | Admitting: *Deleted

## 2020-06-22 DIAGNOSIS — D649 Anemia, unspecified: Secondary | ICD-10-CM

## 2020-06-22 DIAGNOSIS — C182 Malignant neoplasm of ascending colon: Secondary | ICD-10-CM

## 2020-06-22 NOTE — Telephone Encounter (Signed)
Provided wife w/CT scan appointment at [facility] on 10/25 @ 1615/1630. NPO 4 hours prior and drink contrast at 2:30pm and 3:30pm. Can pick up contrast when here tomorrow getting transfusion.

## 2020-06-23 ENCOUNTER — Inpatient Hospital Stay: Payer: Medicare Other

## 2020-06-23 ENCOUNTER — Other Ambulatory Visit: Payer: Self-pay

## 2020-06-23 ENCOUNTER — Telehealth: Payer: Self-pay | Admitting: Nurse Practitioner

## 2020-06-23 ENCOUNTER — Other Ambulatory Visit: Payer: Self-pay | Admitting: Nurse Practitioner

## 2020-06-23 DIAGNOSIS — C182 Malignant neoplasm of ascending colon: Secondary | ICD-10-CM

## 2020-06-23 DIAGNOSIS — D649 Anemia, unspecified: Secondary | ICD-10-CM

## 2020-06-23 LAB — PREPARE RBC (CROSSMATCH)

## 2020-06-23 MED ORDER — SODIUM CHLORIDE 0.9% IV SOLUTION
250.0000 mL | Freq: Once | INTRAVENOUS | Status: AC
  Administered 2020-06-23: 250 mL via INTRAVENOUS
  Filled 2020-06-23: qty 250

## 2020-06-23 NOTE — Patient Instructions (Signed)

## 2020-06-23 NOTE — Telephone Encounter (Signed)
Scheduled per 10/18 los, patient has been called and voicemail was left. 

## 2020-06-24 LAB — BPAM RBC
Blood Product Expiration Date: 202111062359
Blood Product Expiration Date: 202111062359
ISSUE DATE / TIME: 202110221106
ISSUE DATE / TIME: 202110221106
Unit Type and Rh: 6200
Unit Type and Rh: 6200

## 2020-06-24 LAB — TYPE AND SCREEN
ABO/RH(D): A POS
Antibody Screen: NEGATIVE
Unit division: 0
Unit division: 0

## 2020-06-26 ENCOUNTER — Other Ambulatory Visit: Payer: Self-pay

## 2020-06-26 ENCOUNTER — Encounter (HOSPITAL_COMMUNITY): Payer: Self-pay

## 2020-06-26 ENCOUNTER — Ambulatory Visit (HOSPITAL_COMMUNITY)
Admission: RE | Admit: 2020-06-26 | Discharge: 2020-06-26 | Disposition: A | Discharge status: Home / Self Care | Payer: Medicare Other | Source: Ambulatory Visit | Attending: Nurse Practitioner | Admitting: Nurse Practitioner

## 2020-06-26 DIAGNOSIS — C182 Malignant neoplasm of ascending colon: Secondary | ICD-10-CM | POA: Diagnosis present

## 2020-06-27 ENCOUNTER — Telehealth: Payer: Self-pay | Admitting: *Deleted

## 2020-06-27 ENCOUNTER — Other Ambulatory Visit: Payer: Self-pay | Admitting: Nurse Practitioner

## 2020-06-27 DIAGNOSIS — N189 Chronic kidney disease, unspecified: Secondary | ICD-10-CM

## 2020-06-27 DIAGNOSIS — D649 Anemia, unspecified: Secondary | ICD-10-CM

## 2020-06-27 DIAGNOSIS — C182 Malignant neoplasm of ascending colon: Secondary | ICD-10-CM

## 2020-06-27 NOTE — Telephone Encounter (Addendum)
Wife left message asking if lab orders from Dr. Pearson Grippe have been received to draw tomorrow at [facility] at same time labs being collected here? She reports he wants renal functions, CBC and intact PTH. No record of orders received. Per staff message Dr. Joelyn Oms sent orders he requested and these were entered. Left VM at home # that orders are in.

## 2020-06-28 ENCOUNTER — Inpatient Hospital Stay: Payer: Medicare Other

## 2020-06-28 ENCOUNTER — Ambulatory Visit: Payer: Medicare Other | Admitting: Nurse Practitioner

## 2020-06-28 ENCOUNTER — Other Ambulatory Visit: Payer: Self-pay

## 2020-06-28 ENCOUNTER — Other Ambulatory Visit: Payer: Medicare Other

## 2020-06-28 ENCOUNTER — Inpatient Hospital Stay (HOSPITAL_BASED_OUTPATIENT_CLINIC_OR_DEPARTMENT_OTHER): Payer: Medicare Other | Admitting: Nurse Practitioner

## 2020-06-28 VITALS — BP 139/49 | HR 53 | Temp 97.8°F | Resp 17

## 2020-06-28 VITALS — BP 140/55 | HR 52 | Temp 99.0°F | Resp 17 | Ht 69.0 in | Wt 164.5 lb

## 2020-06-28 DIAGNOSIS — D649 Anemia, unspecified: Secondary | ICD-10-CM

## 2020-06-28 DIAGNOSIS — C182 Malignant neoplasm of ascending colon: Secondary | ICD-10-CM

## 2020-06-28 DIAGNOSIS — N189 Chronic kidney disease, unspecified: Secondary | ICD-10-CM

## 2020-06-28 LAB — RENAL FUNCTION PANEL
Albumin: 3.1 g/dL — ABNORMAL LOW (ref 3.5–5.0)
Anion gap: 12 (ref 5–15)
BUN: 39 mg/dL — ABNORMAL HIGH (ref 8–23)
CO2: 22 mmol/L (ref 22–32)
Calcium: 8.6 mg/dL — ABNORMAL LOW (ref 8.9–10.3)
Chloride: 109 mmol/L (ref 98–111)
Creatinine, Ser: 2.69 mg/dL — ABNORMAL HIGH (ref 0.61–1.24)
GFR, Estimated: 22 mL/min — ABNORMAL LOW (ref >60)
Glucose, Bld: 118 mg/dL — ABNORMAL HIGH (ref 70–99)
Phosphorus: 5.1 mg/dL — ABNORMAL HIGH (ref 2.5–4.6)
Potassium: 5 mmol/L (ref 3.5–5.1)
Sodium: 143 mmol/L (ref 135–145)

## 2020-06-28 LAB — CBC WITH DIFFERENTIAL (CANCER CENTER ONLY)
Abs Immature Granulocytes: 0.02 10*3/uL (ref 0.00–0.07)
Basophils Absolute: 0 10*3/uL (ref 0.0–0.1)
Basophils Relative: 0 %
Eosinophils Absolute: 0.3 10*3/uL (ref 0.0–0.5)
Eosinophils Relative: 4 %
HCT: 34 % — ABNORMAL LOW (ref 39.0–52.0)
Hemoglobin: 10.4 g/dL — ABNORMAL LOW (ref 13.0–17.0)
Immature Granulocytes: 0 %
Lymphocytes Relative: 20 %
Lymphs Abs: 1.5 10*3/uL (ref 0.7–4.0)
MCH: 24.9 pg — ABNORMAL LOW (ref 26.0–34.0)
MCHC: 30.6 g/dL (ref 30.0–36.0)
MCV: 81.3 fL (ref 80.0–100.0)
Monocytes Absolute: 0.6 10*3/uL (ref 0.1–1.0)
Monocytes Relative: 8 %
Neutro Abs: 5 10*3/uL (ref 1.7–7.7)
Neutrophils Relative %: 68 %
Platelet Count: 241 10*3/uL (ref 150–400)
RBC: 4.18 MIL/uL — ABNORMAL LOW (ref 4.22–5.81)
RDW: 16.5 % — ABNORMAL HIGH (ref 11.5–15.5)
WBC Count: 7.5 10*3/uL (ref 4.0–10.5)
nRBC: 0 % (ref 0.0–0.2)

## 2020-06-28 LAB — OCCULT BLOOD X 1 CARD TO LAB, STOOL
Fecal Occult Bld: POSITIVE — AB
Fecal Occult Bld: POSITIVE — AB
Fecal Occult Bld: POSITIVE — AB

## 2020-06-28 LAB — IRON AND TIBC
Iron: 29 ug/dL — ABNORMAL LOW (ref 42–163)
Saturation Ratios: 10 % — ABNORMAL LOW (ref 20–55)
TIBC: 285 ug/dL (ref 202–409)
UIBC: 256 ug/dL (ref 117–376)

## 2020-06-28 LAB — CMP (CANCER CENTER ONLY)
ALT: 18 U/L (ref 0–44)
AST: 14 U/L — ABNORMAL LOW (ref 15–41)
Albumin: 3 g/dL — ABNORMAL LOW (ref 3.5–5.0)
Alkaline Phosphatase: 62 U/L (ref 38–126)
Anion gap: 7 (ref 5–15)
BUN: 36 mg/dL — ABNORMAL HIGH (ref 8–23)
CO2: 25 mmol/L (ref 22–32)
Calcium: 8.8 mg/dL — ABNORMAL LOW (ref 8.9–10.3)
Chloride: 109 mmol/L (ref 98–111)
Creatinine: 2.66 mg/dL — ABNORMAL HIGH (ref 0.61–1.24)
GFR, Estimated: 23 mL/min — ABNORMAL LOW (ref >60)
Glucose, Bld: 118 mg/dL — ABNORMAL HIGH (ref 70–99)
Potassium: 5 mmol/L (ref 3.5–5.1)
Sodium: 141 mmol/L (ref 135–145)
Total Bilirubin: 0.4 mg/dL (ref 0.3–1.2)
Total Protein: 6.3 g/dL — ABNORMAL LOW (ref 6.5–8.1)

## 2020-06-28 MED ORDER — SODIUM CHLORIDE 0.9 % IV SOLN
510.0000 mg | Freq: Once | INTRAVENOUS | Status: AC
  Administered 2020-06-28: 510 mg via INTRAVENOUS
  Filled 2020-06-28: qty 510

## 2020-06-28 MED ORDER — SODIUM CHLORIDE 0.9 % IV SOLN
Freq: Once | INTRAVENOUS | Status: AC
  Filled 2020-06-28: qty 250

## 2020-06-28 NOTE — Addendum Note (Signed)
Addended by: Stephenie Acres on: 06/28/2020 10:59 AM   Modules accepted: Orders

## 2020-06-28 NOTE — Patient Instructions (Signed)

## 2020-06-28 NOTE — Progress Notes (Signed)
[facility] OFFICE PROGRESS NOTE   Diagnosis: Colon cancer  INTERVAL HISTORY:   Bradley [name] returns as scheduled.  He was transfused 2 units of blood 06/23/2020.  His wife notes that he is feeling better since the transfusion.  He is not aware of any bleeding.  He denies shortness of breath.  He denies pain.  Objective:  Vital signs in last 24 hours:  Blood pressure (!) 140/55, pulse (!) 52, temperature 99 F (37.2 C), temperature source Tympanic, resp. rate 17, height 5' 9" (1.753 m), weight 164 lb 8 oz (74.6 kg), SpO2 100 %.   Resp: Lungs clear bilaterally. Cardio: Regular rate and rhythm. GI: Abdomen soft and nontender.  No hepatomegaly. Vascular: No leg edema.   Lab Results:  Lab Results  Component Value Date   WBC 7.5 06/28/2020   HGB 10.4 (L) 06/28/2020   HCT 34.0 (L) 06/28/2020   MCV 81.3 06/28/2020   PLT 241 06/28/2020   NEUTROABS 5.0 06/28/2020    Imaging:  CT Abdomen Pelvis Wo Contrast  Addendum Date: 06/27/2020   ADDENDUM REPORT: 06/27/2020 11:21 ADDENDUM: Findings discussed by telephone with Lisa Thomas, NP, at 11:15 a.m., 06/27/2020. Electronically Signed   By: Alex  Bibbey M.D.   On: 06/27/2020 11:21   Result Date: 06/27/2020 CLINICAL DATA:  History of colon cancer, rising CEA EXAM: CT CHEST, ABDOMEN AND PELVIS WITHOUT CONTRAST TECHNIQUE: Multidetector CT imaging of the chest, abdomen and pelvis was performed following the standard protocol without IV contrast. Additional oral enteric contrast was administered. COMPARISON:  11/19/2019, 09/03/2018 FINDINGS: CT CHEST FINDINGS Cardiovascular: Aortic atherosclerosis. Mild cardiomegaly. Three-vessel coronary artery calcifications and/or stents. Unchanged trace pericardial effusion. Mediastinum/Nodes: No enlarged mediastinal, hilar, or axillary lymph nodes. Thyroid gland, trachea, and esophagus demonstrate no significant findings. Lungs/Pleura: Stable, benign 2 mm pulmonary nodule of the left lower  lobe (series 6, image 108). No pleural effusion or pneumothorax. Musculoskeletal: No chest wall mass or suspicious bone lesions identified. CT ABDOMEN PELVIS FINDINGS Hepatobiliary: No focal liver abnormality is seen. Status post cholecystectomy. No biliary dilatation. Pancreas: Unremarkable. No pancreatic ductal dilatation or surrounding inflammatory changes. Spleen: Normal in size without significant abnormality. Adrenals/Urinary Tract: Adrenal glands are unremarkable. Fluid attenuation lesions of the kidneys, incompletely characterized although stable and likely simple cysts. No hydronephrosis. Bladder is unremarkable. Stomach/Bowel: Stomach is within normal limits. Status post right hemicolectomy. There are multiple new and enlarging soft tissue nodules about the anastomotic site in the upper abdomen, anterior to the stomach and liver as well as within the mesocolon, the largest measuring 3.5 x 3.4 cm (series 2, image 69). Sigmoid diverticulosis. Vascular/Lymphatic: Aortic atherosclerosis. No enlarged abdominal or pelvic lymph nodes. Reproductive: Prostatomegaly. Other: Fat containing bilateral inguinal hernias.  Trace ascites. Musculoskeletal: No acute or significant osseous findings. IMPRESSION: 1. Status post right hemicolectomy. There are multiple new and enlarging soft tissue nodules about the anastomotic site in the upper abdomen, anterior to the stomach and liver as well as within the mesocolon. Findings are consistent with locally recurrent and metastatic disease. 2. No noncontrast evidence of distant metastatic disease directly involving the abdominal organs or in the chest. 3. Trace ascites. 4. Prostatomegaly. 5. Coronary artery disease.   Aortic Atherosclerosis (ICD10-I70.0). Call report request placed at the time of interpretation. Final communication will be documented. Electronically Signed: By: Alex  Bibbey M.D. On: 06/27/2020 09:29   CT Chest Wo Contrast  Addendum Date: 06/27/2020   ADDENDUM  REPORT: 06/27/2020 11:21 ADDENDUM: Findings discussed by telephone with Lisa Thomas,   NP, at 11:15 a.m., 06/27/2020. Electronically Signed   By: Alex  Bibbey M.D.   On: 06/27/2020 11:21   Result Date: 06/27/2020 CLINICAL DATA:  History of colon cancer, rising CEA EXAM: CT CHEST, ABDOMEN AND PELVIS WITHOUT CONTRAST TECHNIQUE: Multidetector CT imaging of the chest, abdomen and pelvis was performed following the standard protocol without IV contrast. Additional oral enteric contrast was administered. COMPARISON:  11/19/2019, 09/03/2018 FINDINGS: CT CHEST FINDINGS Cardiovascular: Aortic atherosclerosis. Mild cardiomegaly. Three-vessel coronary artery calcifications and/or stents. Unchanged trace pericardial effusion. Mediastinum/Nodes: No enlarged mediastinal, hilar, or axillary lymph nodes. Thyroid gland, trachea, and esophagus demonstrate no significant findings. Lungs/Pleura: Stable, benign 2 mm pulmonary nodule of the left lower lobe (series 6, image 108). No pleural effusion or pneumothorax. Musculoskeletal: No chest wall mass or suspicious bone lesions identified. CT ABDOMEN PELVIS FINDINGS Hepatobiliary: No focal liver abnormality is seen. Status post cholecystectomy. No biliary dilatation. Pancreas: Unremarkable. No pancreatic ductal dilatation or surrounding inflammatory changes. Spleen: Normal in size without significant abnormality. Adrenals/Urinary Tract: Adrenal glands are unremarkable. Fluid attenuation lesions of the kidneys, incompletely characterized although stable and likely simple cysts. No hydronephrosis. Bladder is unremarkable. Stomach/Bowel: Stomach is within normal limits. Status post right hemicolectomy. There are multiple new and enlarging soft tissue nodules about the anastomotic site in the upper abdomen, anterior to the stomach and liver as well as within the mesocolon, the largest measuring 3.5 x 3.4 cm (series 2, image 69). Sigmoid diverticulosis. Vascular/Lymphatic: Aortic  atherosclerosis. No enlarged abdominal or pelvic lymph nodes. Reproductive: Prostatomegaly. Other: Fat containing bilateral inguinal hernias.  Trace ascites. Musculoskeletal: No acute or significant osseous findings. IMPRESSION: 1. Status post right hemicolectomy. There are multiple new and enlarging soft tissue nodules about the anastomotic site in the upper abdomen, anterior to the stomach and liver as well as within the mesocolon. Findings are consistent with locally recurrent and metastatic disease. 2. No noncontrast evidence of distant metastatic disease directly involving the abdominal organs or in the chest. 3. Trace ascites. 4. Prostatomegaly. 5. Coronary artery disease.   Aortic Atherosclerosis (ICD10-I70.0). Call report request placed at the time of interpretation. Final communication will be documented. Electronically Signed: By: Alex  Bibbey M.D. On: 06/27/2020 09:29    Medications: I have reviewed the patient's current medications.  Assessment/Plan: 1. Adenocarcinoma of the right colon(hepatic flexure), stage IIb (T4a, N0), status post a right colectomy 10/08/2018  Colonoscopy 08/04/2018-mass at the cecum, multiple polyps, biopsy of the cecal mass confirmed invasive adenocarcinoma, the polyps from the transverse colon and sigmoid colon returned as tubular adenomas  0/19 lymph nodes, no lymphovascular or perineural invasion, tumor disrupted with tumor less than 0.1 cm from the serosal surface, inked margin involved by tumor (discussed with Dr. White. He reports the tumor was located at the hepatic flexure. There was no gross involvement of adjacent structures. The tumor fractured upon removal from the abdomen. The positive inked margin is related to the tumor fracture. He does not recommend adjuvant radiation).  MSS, no loss of mismatch repair protein expression  Normal preoperative CEA  CT abdomen/pelvis 08/14/2018 with irregular wall thickening at the hepatic flexure and irregular  wall thickening at the cecum, no evidence of metastatic disease  CT chest 09/03/2018-no evidence of metastatic disease  Patient declined adjuvant chemotherapy  CTs 11/19/2019-negative for recurrent disease  CEA elevated 05/26/2020  CTs 06/26/2020-multiple new and enlarging soft tissue nodules about the anastomotic site in the upper abdomen, anterior to the stomach and liver as well as within the mesocolon.  2.Chronic   renal failure 3.Anemia secondary to #1 and #2-ferritin 17 on 05/26/2020; transfused 2 units of blood 06/23/2020; stool positive for occult blood 06/28/2020 4.Coronary artery disease 5.Multiple polyps-tubular adenomas noted on the 08/04/2018 colonoscopy and the 10/08/2018 right colectomy specimen  Colonoscopy 10/05/2019-tubular adenoma removed from the transverse colon  Disposition: Bradley [name] appears unchanged.  The CT scan from 06/26/2020 showed multiple new and enlarging soft tissue nodules in the abdomen.  He and his wife understand findings are consistent with recurrence of colon cancer.  Images were reviewed with them at today's visit.  Dr. Sherrill discussed a trial of systemic therapy versus supportive care.  He is undecided.  He would like to return for additional discussion at a later date.  We reviewed the CBC from today.  Hemoglobin is better.  He is scheduled for the first dose of IV iron today, next in 1 week.  He will return for lab and follow-up on 07/14/2020.  We are available to see him sooner if needed.  Patient seen with Dr. Sherrill.    Lisa Thomas ANP/GNP-BC   06/28/2020  11:37 AM This was a shared visit with Lisa Thomas.  Ms. Uplinger feels better after the red cell transfusion.  I suspect he has iron deficiency anemia related to chronic GI blood loss, potentially due to tumor involving the bowel.  The restaging CT is consistent with development of metastatic colon cancer involving mesenteric nodules.  We discussed the CT findings and reviewed the images  of Bradley [name] and his wife.  We discussed the high likelihood of the CT representing a diagnosis of metastatic colon cancer.  He understands no therapy will be curative.  We discussed observation versus a trial of chemotherapy.  Bradley [name] has renal insufficiency which limits chemotherapy options.  The plan is to follow him with observation for now.  He will be treated with IV iron over the next 2 weeks and then return for further discussion  Brad Sherrill, MD       

## 2020-06-29 ENCOUNTER — Telehealth: Payer: Self-pay | Admitting: Oncology

## 2020-06-29 LAB — PTH, INTACT AND CALCIUM
Calcium, Total (PTH): 8.2 mg/dL — ABNORMAL LOW (ref 8.6–10.2)
PTH: 61 pg/mL (ref 15–65)

## 2020-06-29 NOTE — Telephone Encounter (Signed)
Scheduled appointment per 10/27 los. Spoke to patient's wife who is aware of appointment date and time.

## 2020-07-05 ENCOUNTER — Inpatient Hospital Stay: Payer: Medicare Other | Attending: Oncology

## 2020-07-05 ENCOUNTER — Other Ambulatory Visit: Payer: Self-pay

## 2020-07-05 VITALS — BP 113/60 | HR 46 | Temp 98.1°F | Resp 18

## 2020-07-05 DIAGNOSIS — D509 Iron deficiency anemia, unspecified: Secondary | ICD-10-CM | POA: Insufficient documentation

## 2020-07-05 DIAGNOSIS — D630 Anemia in neoplastic disease: Secondary | ICD-10-CM | POA: Diagnosis not present

## 2020-07-05 DIAGNOSIS — C786 Secondary malignant neoplasm of retroperitoneum and peritoneum: Secondary | ICD-10-CM | POA: Diagnosis not present

## 2020-07-05 DIAGNOSIS — C182 Malignant neoplasm of ascending colon: Secondary | ICD-10-CM

## 2020-07-05 MED ORDER — SODIUM CHLORIDE 0.9 % IV SOLN
Freq: Once | INTRAVENOUS | Status: AC
  Filled 2020-07-05: qty 250

## 2020-07-05 MED ORDER — SODIUM CHLORIDE 0.9 % IV SOLN
510.0000 mg | Freq: Once | INTRAVENOUS | Status: AC
  Administered 2020-07-05: 510 mg via INTRAVENOUS
  Filled 2020-07-05: qty 510

## 2020-07-05 NOTE — Patient Instructions (Signed)

## 2020-07-14 ENCOUNTER — Other Ambulatory Visit: Payer: Self-pay

## 2020-07-14 ENCOUNTER — Inpatient Hospital Stay: Payer: Medicare Other

## 2020-07-14 ENCOUNTER — Inpatient Hospital Stay (HOSPITAL_BASED_OUTPATIENT_CLINIC_OR_DEPARTMENT_OTHER): Payer: Medicare Other | Admitting: Oncology

## 2020-07-14 VITALS — BP 151/50 | HR 47 | Temp 98.3°F | Resp 17 | Ht 69.0 in | Wt 164.0 lb

## 2020-07-14 DIAGNOSIS — C182 Malignant neoplasm of ascending colon: Secondary | ICD-10-CM

## 2020-07-14 LAB — CBC WITH DIFFERENTIAL (CANCER CENTER ONLY)
Abs Immature Granulocytes: 0.02 10*3/uL (ref 0.00–0.07)
Basophils Absolute: 0 10*3/uL (ref 0.0–0.1)
Basophils Relative: 0 %
Eosinophils Absolute: 0.3 10*3/uL (ref 0.0–0.5)
Eosinophils Relative: 5 %
HCT: 32 % — ABNORMAL LOW (ref 39.0–52.0)
Hemoglobin: 9.9 g/dL — ABNORMAL LOW (ref 13.0–17.0)
Immature Granulocytes: 0 %
Lymphocytes Relative: 27 %
Lymphs Abs: 1.5 10*3/uL (ref 0.7–4.0)
MCH: 25.6 pg — ABNORMAL LOW (ref 26.0–34.0)
MCHC: 30.9 g/dL (ref 30.0–36.0)
MCV: 82.9 fL (ref 80.0–100.0)
Monocytes Absolute: 0.6 10*3/uL (ref 0.1–1.0)
Monocytes Relative: 10 %
Neutro Abs: 3.4 10*3/uL (ref 1.7–7.7)
Neutrophils Relative %: 58 %
Platelet Count: 181 10*3/uL (ref 150–400)
RBC: 3.86 MIL/uL — ABNORMAL LOW (ref 4.22–5.81)
RDW: 20.1 % — ABNORMAL HIGH (ref 11.5–15.5)
WBC Count: 5.8 10*3/uL (ref 4.0–10.5)
nRBC: 0 % (ref 0.0–0.2)

## 2020-07-14 LAB — SAMPLE TO BLOOD BANK

## 2020-07-14 NOTE — Progress Notes (Signed)
[place] OFFICE PROGRESS NOTE   Diagnosis: Colon cancer  INTERVAL HISTORY:   [name] returns as scheduled.  He is here with his wife.  He reports an improved energy level following the red cell transfusion and IV iron.  No new complaint.  No gross bleeding.  Objective:  Vital signs in last 24 hours:  Blood pressure (!) 151/50, pulse (!) 47, temperature 98.3 F (36.8 C), temperature source Tympanic, resp. rate 17, height '5\' 9"'  (1.753 m), weight 164 lb (74.4 kg), SpO2 99 %.    Resp: Lungs clear bilaterally Cardio: Regular rate and rhythm GI: No mass, nontender, no hepatosplenomegaly Vascular: No leg edema   Lab Results:  Lab Results  Component Value Date   WBC 5.8 07/14/2020   HGB 9.9 (L) 07/14/2020   HCT 32.0 (L) 07/14/2020   MCV 82.9 07/14/2020   PLT 181 07/14/2020   NEUTROABS 3.4 07/14/2020    CMP  Lab Results  Component Value Date   NA 143 06/28/2020   NA 141 06/28/2020   K 5.0 06/28/2020   K 5.0 06/28/2020   CL 109 06/28/2020   CL 109 06/28/2020   CO2 22 06/28/2020   CO2 25 06/28/2020   GLUCOSE 118 (H) 06/28/2020   GLUCOSE 118 (H) 06/28/2020   BUN 39 (H) 06/28/2020   BUN 36 (H) 06/28/2020   CREATININE 2.69 (H) 06/28/2020   CREATININE 2.66 (H) 06/28/2020   CALCIUM 8.2 (L) 06/28/2020   CALCIUM 8.6 (L) 06/28/2020   CALCIUM 8.8 (L) 06/28/2020   PROT 6.3 (L) 06/28/2020   ALBUMIN 3.1 (L) 06/28/2020   ALBUMIN 3.0 (L) 06/28/2020   AST 14 (L) 06/28/2020   ALT 18 06/28/2020   ALKPHOS 62 06/28/2020   BILITOT 0.4 06/28/2020   GFRNONAA 22 (L) 06/28/2020   GFRNONAA 23 (L) 06/28/2020   GFRAA 26 (L) 05/26/2020    Lab Results  Component Value Date   CEA1 18.54 (H) 06/19/2020     Medications: I have reviewed the patient's current medications.   Assessment/Plan: 1. Adenocarcinoma of the right colon(hepatic flexure), stage IIb (T4a, N0), status post a right colectomy 10/08/2018  Colonoscopy 08/04/2018-mass at the cecum, multiple  polyps, biopsy of the cecal mass confirmed invasive adenocarcinoma, the polyps from the transverse colon and sigmoid colon returned as tubular adenomas  0/19 lymph nodes, no lymphovascular or perineural invasion, tumor disrupted with tumor less than 0.1 cm from the serosal surface, inked margin involved by tumor (discussed with Dr. Dema Severin. He reports the tumor was located at the hepatic flexure. There was no gross involvement of adjacent structures. The tumor fractured upon removal from the abdomen. The positive inked margin is related to the tumor fracture. He does not recommend adjuvant radiation).  MSS, no loss of mismatch repair protein expression  Normal preoperative CEA  CT abdomen/pelvis 08/14/2018 with irregular wall thickening at the hepatic flexure and irregular wall thickening at the cecum, no evidence of metastatic disease  CT chest 09/03/2018-no evidence of metastatic disease  Patient declined adjuvant chemotherapy  CTs 11/19/2019-negative for recurrent disease  CEA elevated 05/26/2020  CTs 06/26/2020-multiple new and enlarging soft tissue nodules about the anastomotic site in the upper abdomen, anterior to the stomach and liver as well as within the mesocolon.  2.Chronic renal failure 3.Anemia secondary to #1 and #2-ferritin 17 on 05/26/2020; transfused 2 units of blood 06/23/2020; stool positive for occult blood 06/28/2020  IV iron 06/28/2020 and 07/05/2020 4.Coronary artery disease 5.Multiple polyps-tubular adenomas noted on the 08/04/2018 colonoscopy  and the 10/08/2018 right colectomy specimen  Colonoscopy 10/05/2019-tubular adenoma removed from the transverse colon    Disposition: Mr. Kumari appears stable.  He has been diagnosed with recurrent colon cancer involving mesenteric masses.  I suspect he is bleeding from tumor involving the bowel, potentially at the colonic anastomosis.  I discussed treatment options with Mr. Buchmann and his wife.  We discussed comfort  care to include as needed transfusions versus a trial of systemic therapy.  He is not a candidate for capecitabine secondary to renal failure.  We discussed FOLFOX chemotherapy.  We reviewed potential toxicities associated with the FOLFOX regimen in detail including the chance for nausea/vomiting, mucositis, diarrhea, alopecia, and hematologic toxicity.  We discussed the sun sensitivity, rash, hyperpigmentation, and hand/foot syndrome associated with 5-fluorouracil.  We discussed the allergic reaction various types of neuropathy seen with oxaliplatin.  We discussed the expected response rate and clinical benefit from chemotherapy.  Mr. Ferrelli remains undecided on a course of chemotherapy.  He would like to consider surgery.  I will discuss his case at the GI tumor conference within the next few weeks.  He will return for a lab visit on 07/25/2020 and an office visit on 08/02/2020.  Betsy Coder, MD  07/14/2020  12:18 PM

## 2020-07-18 ENCOUNTER — Telehealth: Payer: Self-pay | Admitting: Oncology

## 2020-07-18 NOTE — Telephone Encounter (Signed)
Scheduled appointments per 11/12 los. Spoke to patient's wife who is aware of appointment date and time.

## 2020-07-19 ENCOUNTER — Other Ambulatory Visit: Payer: Self-pay

## 2020-07-25 ENCOUNTER — Other Ambulatory Visit: Payer: Self-pay

## 2020-07-25 ENCOUNTER — Other Ambulatory Visit: Payer: Medicare Other

## 2020-07-25 ENCOUNTER — Inpatient Hospital Stay: Payer: Medicare Other

## 2020-07-25 ENCOUNTER — Telehealth: Payer: Self-pay | Admitting: *Deleted

## 2020-07-25 DIAGNOSIS — C182 Malignant neoplasm of ascending colon: Secondary | ICD-10-CM

## 2020-07-25 LAB — CBC WITH DIFFERENTIAL (CANCER CENTER ONLY)
Abs Immature Granulocytes: 0.02 10*3/uL (ref 0.00–0.07)
Basophils Absolute: 0 10*3/uL (ref 0.0–0.1)
Basophils Relative: 0 %
Eosinophils Absolute: 0.2 10*3/uL (ref 0.0–0.5)
Eosinophils Relative: 4 %
HCT: 30.8 % — ABNORMAL LOW (ref 39.0–52.0)
Hemoglobin: 9.5 g/dL — ABNORMAL LOW (ref 13.0–17.0)
Immature Granulocytes: 0 %
Lymphocytes Relative: 24 %
Lymphs Abs: 1.4 10*3/uL (ref 0.7–4.0)
MCH: 26.2 pg (ref 26.0–34.0)
MCHC: 30.8 g/dL (ref 30.0–36.0)
MCV: 85.1 fL (ref 80.0–100.0)
Monocytes Absolute: 0.5 10*3/uL (ref 0.1–1.0)
Monocytes Relative: 9 %
Neutro Abs: 3.8 10*3/uL (ref 1.7–7.7)
Neutrophils Relative %: 63 %
Platelet Count: 180 10*3/uL (ref 150–400)
RBC: 3.62 MIL/uL — ABNORMAL LOW (ref 4.22–5.81)
RDW: 21.4 % — ABNORMAL HIGH (ref 11.5–15.5)
WBC Count: 6 10*3/uL (ref 4.0–10.5)
nRBC: 0 % (ref 0.0–0.2)

## 2020-07-25 LAB — SAMPLE TO BLOOD BANK

## 2020-07-25 LAB — FERRITIN: Ferritin: 410 ng/mL — ABNORMAL HIGH (ref 24–336)

## 2020-07-25 NOTE — Telephone Encounter (Signed)
Notified wife of CBC and ferritin results. F/U as scheduled.

## 2020-08-02 ENCOUNTER — Inpatient Hospital Stay (HOSPITAL_BASED_OUTPATIENT_CLINIC_OR_DEPARTMENT_OTHER): Payer: Medicare Other | Admitting: Oncology

## 2020-08-02 ENCOUNTER — Ambulatory Visit: Payer: Medicare Other | Admitting: Oncology

## 2020-08-02 ENCOUNTER — Other Ambulatory Visit: Payer: Self-pay

## 2020-08-02 ENCOUNTER — Other Ambulatory Visit: Payer: Medicare Other

## 2020-08-02 ENCOUNTER — Inpatient Hospital Stay: Payer: Medicare Other | Attending: Oncology

## 2020-08-02 VITALS — BP 139/47 | HR 55 | Temp 97.8°F | Resp 14 | Ht 69.0 in | Wt 166.1 lb

## 2020-08-02 DIAGNOSIS — Z5111 Encounter for antineoplastic chemotherapy: Secondary | ICD-10-CM | POA: Diagnosis present

## 2020-08-02 DIAGNOSIS — Z7189 Other specified counseling: Secondary | ICD-10-CM | POA: Diagnosis not present

## 2020-08-02 DIAGNOSIS — D630 Anemia in neoplastic disease: Secondary | ICD-10-CM | POA: Insufficient documentation

## 2020-08-02 DIAGNOSIS — D631 Anemia in chronic kidney disease: Secondary | ICD-10-CM | POA: Diagnosis not present

## 2020-08-02 DIAGNOSIS — N189 Chronic kidney disease, unspecified: Secondary | ICD-10-CM | POA: Insufficient documentation

## 2020-08-02 DIAGNOSIS — C182 Malignant neoplasm of ascending colon: Secondary | ICD-10-CM | POA: Insufficient documentation

## 2020-08-02 DIAGNOSIS — C786 Secondary malignant neoplasm of retroperitoneum and peritoneum: Secondary | ICD-10-CM | POA: Diagnosis not present

## 2020-08-02 LAB — CBC WITH DIFFERENTIAL (CANCER CENTER ONLY)
Abs Immature Granulocytes: 0.02 10*3/uL (ref 0.00–0.07)
Basophils Absolute: 0 10*3/uL (ref 0.0–0.1)
Basophils Relative: 0 %
Eosinophils Absolute: 0.3 10*3/uL (ref 0.0–0.5)
Eosinophils Relative: 5 %
HCT: 30.7 % — ABNORMAL LOW (ref 39.0–52.0)
Hemoglobin: 9.4 g/dL — ABNORMAL LOW (ref 13.0–17.0)
Immature Granulocytes: 0 %
Lymphocytes Relative: 27 %
Lymphs Abs: 1.6 10*3/uL (ref 0.7–4.0)
MCH: 26.9 pg (ref 26.0–34.0)
MCHC: 30.6 g/dL (ref 30.0–36.0)
MCV: 88 fL (ref 80.0–100.0)
Monocytes Absolute: 0.6 10*3/uL (ref 0.1–1.0)
Monocytes Relative: 10 %
Neutro Abs: 3.3 10*3/uL (ref 1.7–7.7)
Neutrophils Relative %: 58 %
Platelet Count: 166 10*3/uL (ref 150–400)
RBC: 3.49 MIL/uL — ABNORMAL LOW (ref 4.22–5.81)
RDW: 22.7 % — ABNORMAL HIGH (ref 11.5–15.5)
WBC Count: 5.8 10*3/uL (ref 4.0–10.5)
nRBC: 0 % (ref 0.0–0.2)

## 2020-08-02 LAB — SAMPLE TO BLOOD BANK

## 2020-08-02 NOTE — Progress Notes (Signed)
[facility] OFFICE PROGRESS NOTE   Diagnosis: Colon cancer, anemia  INTERVAL HISTORY:   Bradley [name] returns as scheduled.  He feels well.  No bleeding.  No complaint.  [place]  He is here today with his wife.  Objective:  Vital signs in last 24 hours:  Blood pressure (!) 139/47, pulse (!) 55, temperature 97.8 F (36.6 C), temperature source Tympanic, resp. rate 14, height _0  (1.753 m), weight 166 lb 1.6 oz (75.3 kg), SpO2 100 %.   Resp: Lungs clear bilaterally Cardio: Regular rate and rhythm GI: No hepatosplenomegaly, no mass, nontender Vascular: No leg edema   Lab Results:  Lab Results  Component Value Date   WBC 5.8 08/02/2020   HGB 9.4 (L) 08/02/2020   HCT 30.7 (L) 08/02/2020   MCV 88.0 08/02/2020   PLT 166 08/02/2020   NEUTROABS 3.3 08/02/2020    CMP  Lab Results  Component Value Date   NA 143 06/28/2020   NA 141 06/28/2020   K 5.0 06/28/2020   K 5.0 06/28/2020   CL 109 06/28/2020   CL 109 06/28/2020   CO2 22 06/28/2020   CO2 25 06/28/2020   GLUCOSE 118 (H) 06/28/2020   GLUCOSE 118 (H) 06/28/2020   BUN 39 (H) 06/28/2020   BUN 36 (H) 06/28/2020   CREATININE 2.69 (H) 06/28/2020   CREATININE 2.66 (H) 06/28/2020   CALCIUM 8.2 (L) 06/28/2020   CALCIUM 8.6 (L) 06/28/2020   CALCIUM 8.8 (L) 06/28/2020   PROT 6.3 (L) 06/28/2020   ALBUMIN 3.1 (L) 06/28/2020   ALBUMIN 3.0 (L) 06/28/2020   AST 14 (L) 06/28/2020   ALT 18 06/28/2020   ALKPHOS 62 06/28/2020   BILITOT 0.4 06/28/2020   GFRNONAA 22 (L) 06/28/2020   GFRNONAA 23 (L) 06/28/2020   GFRAA 26 (L) 05/26/2020    Lab Results  Component Value Date   CEA1 18.54 (H) 06/19/2020    Medications: I have reviewed the patient's current medications.   Assessment/Plan: 1. Adenocarcinoma of the right colon(hepatic flexure), stage IIb (T4a, N0), status post a right colectomy 10/08/2018  Colonoscopy 08/04/2018-mass at the cecum, multiple polyps, biopsy of  the cecal mass confirmed invasive adenocarcinoma, the polyps from the transverse colon and sigmoid colon returned as tubular adenomas  0/19 lymph nodes, no lymphovascular or perineural invasion, tumor disrupted with tumor less than 0.1 cm from the serosal surface, inked margin involved by tumor (discussed with Dr. Dema Severin. He reports the tumor was located at the hepatic flexure. There was no gross involvement of adjacent structures. The tumor fractured upon removal from the abdomen. The positive inked margin is related to the tumor fracture. He does not recommend adjuvant radiation).  MSS, no loss of mismatch repair protein expression  Normal preoperative CEA  CT abdomen/pelvis 08/14/2018 with irregular wall thickening at the hepatic flexure and irregular wall thickening at the cecum, no evidence of metastatic disease  CT chest 09/03/2018-no evidence of metastatic disease  Patient declined adjuvant chemotherapy  CTs 11/19/2019-negative for recurrent disease  CEA elevated 05/26/2020  CTs 06/26/2020-multiple new and enlarging soft tissue nodules about the anastomotic site in the upper abdomen, anterior to the stomach and liver as well as within the mesocolon.  2.Chronic renal failure 3.Anemia secondary to #1 and #2-ferritin 17 on 05/26/2020; transfused 2 units of blood 06/23/2020; stool positive for occult blood 06/28/2020  IV iron 06/28/2020 and 07/05/2020 4.Coronary artery disease 5.Multiple polyps-tubular adenomas noted on the 08/04/2018 colonoscopy and the  10/08/2018 right colectomy specimen  Colonoscopy 10/05/2019-tubular adenoma removed from the transverse colon    Disposition: Bradley [name] appears stable.  His case was presented at the GI tumor conference last week.  He appears to have local recurrence of colon cancer.  Surgical resection is not recommended.  I offered Bradley [name] a referral to Dr. Dema Severin for further discussion of surgical options.  He does not wish to schedule an  appointment at present.  We discussed supportive care versus a trial of systemic chemotherapy.  He understands no therapy will be curative.  He would like to proceed with a trial of FOLFOX.  We reviewed potential toxicities associated with the FOLFOX regimen including the chance for nausea/vomiting, mucositis, diarrhea, alopecia, and hematologic toxicity.  We discussed the rash, sun sensitivity, hyperpigmentation, and hand/foot syndrome associated with 5-fluorouracil.  We reviewed the allergic reaction and various types of neuropathy associated with oxaliplatin.  He agrees to proceed.  He will attend a chemotherapy teaching class.  Bradley [name] will be referred for Port-A-Cath placement.  The plan is to begin FOLFOX on 08/14/2020.  We will submit the colon cancer tissue for Foundation 1 testing.  A chemotherapy plan was entered.  Betsy Coder, MD  08/02/2020  10:38 AM

## 2020-08-02 NOTE — Progress Notes (Signed)
START ON PATHWAY REGIMEN - Colorectal     A cycle is every 14 days:     Oxaliplatin      Leucovorin      Fluorouracil      Fluorouracil   **Always confirm dose/schedule in your pharmacy ordering system**  Patient Characteristics: Distant Metastases, Nonsurgical Candidate, KRAS/NRAS Mutation Positive/Unknown (BRAF V600 Wild-Type/Unknown), Standard Cytotoxic Therapy, First Line Standard Cytotoxic Therapy, Bevacizumab Ineligible, PS = 0,1 Tumor Location: Colon Therapeutic Status: Distant Metastases Microsatellite/Mismatch Repair Status: MSS/pMMR BRAF Mutation Status: Awaiting Test Results KRAS/NRAS Mutation Status: Awaiting Test Results Standard Cytotoxic Line of Therapy: First Line Standard Cytotoxic Therapy ECOG Performance Status: 1 Bevacizumab Eligibility: Ineligible Intent of Therapy: Non-Curative / Palliative Intent, Discussed with Patient 

## 2020-08-03 ENCOUNTER — Telehealth: Payer: Self-pay | Admitting: Oncology

## 2020-08-03 NOTE — Telephone Encounter (Signed)
Scheduled appointments per 12/1 los. Spoke to patient's wife who is aware of appointments dates and times.

## 2020-08-07 ENCOUNTER — Other Ambulatory Visit: Payer: Medicare Other

## 2020-08-07 ENCOUNTER — Encounter (HOSPITAL_COMMUNITY): Payer: Self-pay | Admitting: Surgery

## 2020-08-07 ENCOUNTER — Ambulatory Visit: Payer: Self-pay | Admitting: Surgery

## 2020-08-07 ENCOUNTER — Other Ambulatory Visit: Payer: Self-pay

## 2020-08-07 DIAGNOSIS — C182 Malignant neoplasm of ascending colon: Secondary | ICD-10-CM

## 2020-08-07 NOTE — Progress Notes (Signed)
Left voice message for patient regarding appointment for port a cath placement.  Scheduled for this Thursday 08/10/2020 to arrive at 8:00 at [facility], NPO after midnight and he must have a driver.  I have asked that he call me back on my direct line to confirm receipt of this message and that he has an understanding of the instructions that have been left.   11:25  Received a call back from patient's wife Bradley [name] confirming receipt.  She was able to repeat the appointment and instructions back to me.

## 2020-08-07 NOTE — Progress Notes (Signed)
COVID Vaccine Completed: Date COVID Vaccine completed: COVID vaccine manufacturer: [place]   PCP - Lorane Gell Lauris Poag, DO Cardiologist -   Chest x-ray - CT Chest 06/27/20 in epic EKG -  Stress Test -  ECHO -  Cardiac Cath - Greater than 2 years Pacemaker/ICD device last checked:  Sleep Study -  CPAP -   Fasting Blood Sugar -  Checks Blood Sugar _____ times a day  Blood Thinner Instructions: Aspirin Instructions: Last Dose:  Activity level:  Unable to go up a flight of stairs without symptoms   Can go up a flight of stairs without stopping and without symptoms   Able to exercise without symptoms     Anesthesia review: CAD, CKD  Patient denies shortness of breath, fever, cough and chest pain at PAT appointment   Patient verbalized understanding of instructions that were given to them at the PAT appointment. Patient was also instructed that they will need to review over the PAT instructions again at home before surgery.

## 2020-08-07 NOTE — Progress Notes (Signed)
Received call from patient's wife that they received a call from Dr. Orest Dikes office and he is going to place the port.  I have cancelled the appointment with IR.

## 2020-08-07 NOTE — Patient Instructions (Signed)
DUE TO COVID-19 ONLY ONE VISITOR IS ALLOWED TO COME WITH YOU AND STAY IN THE WAITING ROOM ONLY DURING PRE OP AND PROCEDURE.    COVID SWAB TESTING MUST BE COMPLETED ON:  Tuesday, Dec. 7, 2021 at 8:15 AM   [address] [place], [place] [address]  (Must self quarantine after testing. Follow instructions on handout.)   Your procedure is scheduled on: Wednesday, Dec. 8, 2021   Report to [facility] Main  Entrance    Report to admitting at 12:30 PM   Call this number if you have problems the morning of surgery [phone]   Do not eat food :After Midnight.   May have liquids until 11:30 AM   day of surgery  CLEAR LIQUID DIET  Foods Allowed                                                                     Foods Excluded  Water, Black Coffee and tea, regular and decaf                             liquids that you cannot  Plain Jell-O in any flavor  (No red)                                           see through such as: Fruit ices (not with fruit pulp)                                     milk, soups, orange juice              Iced Popsicles (No red)                                    All solid food                                   Apple juices Sports drinks like Gatorade (No red) Lightly seasoned clear broth or consume(fat free) Sugar, honey syrup  Sample Menu Breakfast                                Lunch                                     Supper Cranberry juice                    Beef broth                            Chicken broth Jell-O  Grape juice                           Apple juice Coffee or tea                        Jell-O                                      Popsicle                                                Coffee or tea                        Coffee or tea     Oral Hygiene is also important to reduce your risk of infection.                                    Remember - BRUSH YOUR TEETH THE MORNING OF SURGERY WITH YOUR  REGULAR TOOTHPASTE   Do NOT smoke after Midnight   Take these medicines the morning of surgery with A SIP OF WATER: None                               You may not have any metal on your body including jewelry, and body piercings             Do not wear lotions, powders, perfumes/cologne, or deodorant                           Men may shave face and neck.   Do not bring valuables to the hospital. [place].   Contacts, dentures or bridgework may not be worn into surgery.    Patients discharged the day of surgery will not be allowed to drive home.   Special Instructions: Bring a copy of your healthcare power of attorney and living will documents         the day of surgery if you haven't scanned them in before.              Please read over the following fact sheets you were given: IF YOU HAVE QUESTIONS ABOUT YOUR PRE OP INSTRUCTIONS PLEASE CALL [phone]   [place] - Preparing for Surgery Before surgery, you can play an important role.  Because skin is not sterile, your skin needs to be as free of germs as possible.  You can reduce the number of germs on your skin by washing with CHG (chlorahexidine gluconate) soap before surgery.  CHG is an antiseptic cleaner which kills germs and bonds with the skin to continue killing germs even after washing. Please DO NOT use if you have an allergy to CHG or antibacterial soaps.  If your skin becomes reddened/irritated stop using the CHG and inform your nurse when you arrive at Short Stay. Do not shave (including legs and underarms) for at least  48 hours prior to the first CHG shower.  You may shave your face/neck.  Please follow these instructions carefully:  1.  Shower with CHG Soap the night before surgery and the  morning of surgery.  2.  If you choose to wash your hair, wash your hair first as usual with your normal  shampoo.  3.  After you shampoo, rinse your hair and body thoroughly to  remove the shampoo.                             4.  Use CHG as you would any other liquid soap.  You can apply chg directly to the skin and wash.  Gently with a scrungie or clean washcloth.  5.  Apply the CHG Soap to your body ONLY FROM THE NECK DOWN.   Do   not use on face/ open                           Wound or open sores. Avoid contact with eyes, ears mouth and   genitals (private parts).                       Wash face,  Genitals (private parts) with your normal soap.             6.  Wash thoroughly, paying special attention to the area where your    surgery  will be performed.  7.  Thoroughly rinse your body with warm water from the neck down.  8.  DO NOT shower/wash with your normal soap after using and rinsing off the CHG Soap.                9.  Pat yourself dry with a clean towel.            10.  Wear clean pajamas.            11.  Place clean sheets on your bed the night of your first shower and do not  sleep with pets. Day of Surgery : Do not apply any lotions/deodorants the morning of surgery.  Please wear clean clothes to the hospital/surgery center.  FAILURE TO FOLLOW THESE INSTRUCTIONS MAY RESULT IN THE CANCELLATION OF YOUR SURGERY  PATIENT SIGNATURE_________________________________  NURSE SIGNATURE__________________________________  ________________________________________________________________________

## 2020-08-07 NOTE — Progress Notes (Signed)
I

## 2020-08-08 ENCOUNTER — Encounter (HOSPITAL_COMMUNITY)
Admission: RE | Admit: 2020-08-08 | Discharge: 2020-08-08 | Disposition: A | Discharge status: Home / Self Care | Payer: Medicare Other | Source: Ambulatory Visit | Attending: Surgery | Admitting: Surgery

## 2020-08-08 ENCOUNTER — Inpatient Hospital Stay: Payer: Medicare Other

## 2020-08-08 ENCOUNTER — Other Ambulatory Visit (HOSPITAL_COMMUNITY)
Admission: RE | Admit: 2020-08-08 | Discharge: 2020-08-08 | Disposition: A | Discharge status: Home / Self Care | Payer: Medicare Other | Source: Ambulatory Visit | Attending: Surgery | Admitting: Surgery

## 2020-08-08 ENCOUNTER — Other Ambulatory Visit: Payer: Self-pay

## 2020-08-08 ENCOUNTER — Encounter (HOSPITAL_COMMUNITY): Payer: Self-pay

## 2020-08-08 DIAGNOSIS — Z7982 Long term (current) use of aspirin: Secondary | ICD-10-CM | POA: Diagnosis not present

## 2020-08-08 DIAGNOSIS — N189 Chronic kidney disease, unspecified: Secondary | ICD-10-CM | POA: Insufficient documentation

## 2020-08-08 DIAGNOSIS — Z20822 Contact with and (suspected) exposure to covid-19: Secondary | ICD-10-CM | POA: Insufficient documentation

## 2020-08-08 DIAGNOSIS — Z01818 Encounter for other preprocedural examination: Secondary | ICD-10-CM | POA: Insufficient documentation

## 2020-08-08 DIAGNOSIS — Z9049 Acquired absence of other specified parts of digestive tract: Secondary | ICD-10-CM | POA: Diagnosis not present

## 2020-08-08 DIAGNOSIS — Z79899 Other long term (current) drug therapy: Secondary | ICD-10-CM | POA: Diagnosis not present

## 2020-08-08 DIAGNOSIS — C189 Malignant neoplasm of colon, unspecified: Secondary | ICD-10-CM | POA: Insufficient documentation

## 2020-08-08 DIAGNOSIS — Z86010 Personal history of colonic polyps: Secondary | ICD-10-CM | POA: Diagnosis not present

## 2020-08-08 DIAGNOSIS — Z800 Family history of malignant neoplasm of digestive organs: Secondary | ICD-10-CM | POA: Insufficient documentation

## 2020-08-08 DIAGNOSIS — Z803 Family history of malignant neoplasm of breast: Secondary | ICD-10-CM | POA: Insufficient documentation

## 2020-08-08 DIAGNOSIS — I2510 Atherosclerotic heart disease of native coronary artery without angina pectoris: Secondary | ICD-10-CM | POA: Insufficient documentation

## 2020-08-08 DIAGNOSIS — R001 Bradycardia, unspecified: Secondary | ICD-10-CM | POA: Diagnosis not present

## 2020-08-08 HISTORY — DX: Personal history of other diseases of the nervous system and sense organs: Z86.69

## 2020-08-08 LAB — COMPREHENSIVE METABOLIC PANEL
ALT: 27 U/L (ref 0–44)
AST: 25 U/L (ref 15–41)
Albumin: 3.5 g/dL (ref 3.5–5.0)
Alkaline Phosphatase: 65 U/L (ref 38–126)
Anion gap: 9 (ref 5–15)
BUN: 38 mg/dL — ABNORMAL HIGH (ref 8–23)
CO2: 23 mmol/L (ref 22–32)
Calcium: 8.4 mg/dL — ABNORMAL LOW (ref 8.9–10.3)
Chloride: 107 mmol/L (ref 98–111)
Creatinine, Ser: 2.46 mg/dL — ABNORMAL HIGH (ref 0.61–1.24)
GFR, Estimated: 25 mL/min — ABNORMAL LOW (ref >60)
Glucose, Bld: 108 mg/dL — ABNORMAL HIGH (ref 70–99)
Potassium: 4.7 mmol/L (ref 3.5–5.1)
Sodium: 139 mmol/L (ref 135–145)
Total Bilirubin: 0.6 mg/dL (ref 0.3–1.2)
Total Protein: 5.8 g/dL — ABNORMAL LOW (ref 6.5–8.1)

## 2020-08-08 LAB — CBC WITH DIFFERENTIAL/PLATELET
Abs Immature Granulocytes: 0.04 10*3/uL (ref 0.00–0.07)
Basophils Absolute: 0 10*3/uL (ref 0.0–0.1)
Basophils Relative: 0 %
Eosinophils Absolute: 0.3 10*3/uL (ref 0.0–0.5)
Eosinophils Relative: 5 %
HCT: 31.4 % — ABNORMAL LOW (ref 39.0–52.0)
Hemoglobin: 9.6 g/dL — ABNORMAL LOW (ref 13.0–17.0)
Immature Granulocytes: 1 %
Lymphocytes Relative: 23 %
Lymphs Abs: 1.3 10*3/uL (ref 0.7–4.0)
MCH: 27.6 pg (ref 26.0–34.0)
MCHC: 30.6 g/dL (ref 30.0–36.0)
MCV: 90.2 fL (ref 80.0–100.0)
Monocytes Absolute: 0.6 10*3/uL (ref 0.1–1.0)
Monocytes Relative: 10 %
Neutro Abs: 3.4 10*3/uL (ref 1.7–7.7)
Neutrophils Relative %: 61 %
Platelets: 169 10*3/uL (ref 150–400)
RBC: 3.48 MIL/uL — ABNORMAL LOW (ref 4.22–5.81)
RDW: 22.6 % — ABNORMAL HIGH (ref 11.5–15.5)
WBC: 5.6 10*3/uL (ref 4.0–10.5)
nRBC: 0 % (ref 0.0–0.2)

## 2020-08-08 LAB — SARS CORONAVIRUS 2 (TAT 6-24 HRS): SARS Coronavirus 2: NEGATIVE

## 2020-08-08 LAB — PROTIME-INR
INR: 1 (ref 0.8–1.2)
Prothrombin Time: 13.2 seconds (ref 11.4–15.2)

## 2020-08-08 LAB — APTT: aPTT: 33 seconds (ref 24–36)

## 2020-08-08 NOTE — Progress Notes (Addendum)
COVID Vaccine Completed: Yes Date COVID Vaccine completed: 09/19/19, 10/09/19, 05/19/20 COVID vaccine manufacturer: [place]      PCP - Jackquline Berlin, DO Cardiologist - Dr. Jenell Milliner   Chest x-ray - CT Chest 06/27/20 in epic EKG - 08/08/2020 Stress Test - Greater than 2 years ECHO - N/A Cardiac Cath - Greater than 2 years Pacemaker/ICD device last checked:N/A  Sleep Study - N/A CPAP - N/A  Fasting Blood Sugar - N/A Checks Blood Sugar _N/A____ times a day  Blood Thinner Instructions: N/A Aspirin Instructions:N/A Last Dose:N/A  Activity level:  Able to exercise without symptoms     Anesthesia review: CAD, CKD (creatinine 2.46, BUN 38 08/08/20)  Patient denies shortness of breath, fever, cough and chest pain at PAT appointment   Patient verbalized understanding of instructions that were given to them at the PAT appointment. Patient was also instructed that they will need to review over the PAT instructions again at home before surgery.

## 2020-08-08 NOTE — Progress Notes (Signed)
Anesthesia Chart Review   Case: [id number] Date/Time: 08/09/20 1415   Procedure: INSERTION PORT-A-CATH (N/A )   Anesthesia type: General   Pre-op diagnosis: colon cancer   Location: [place] / [facility]   Surgeons: Ileana Roup, MD      DISCUSSION:86 y.o. never smoker with h/o CAD, bradycardia, CKD, colon cancer scheduled for above procedure 08/09/2020 with Dr. Nadeen Landau.   Pt last seen by cardiologist 02/07/2020. Per OV note, "Patient is stable without any symptoms of angina or ischemia. Continue aggressive secondary preventive therapy with aspirin, atenolol, atorvastatin. Follow-up in 1 year."  Anticipate pt can proceed with planned procedure barring acute status change.    VS: BP (!) 116/51   Pulse (!) 50   Temp 36.5 C (Oral)   Resp 16   Ht 5\' 9"  (1.753 m)   Wt 74.8 kg   SpO2 100%   BMI 24.37 kg/m   PROVIDERS: Brackman, Saundra Shelling, DO is PCP   Jenell Milliner, MD is Cardiologist  LABS: Labs reviewed: Acceptable for surgery. (all labs ordered are listed, but only abnormal results are displayed)  Labs Reviewed  CBC WITH DIFFERENTIAL/PLATELET - Abnormal; Notable for the following components:      Result Value   RBC 3.48 (*)    Hemoglobin 9.6 (*)    HCT 31.4 (*)    RDW 22.6 (*)    All other components within normal limits  COMPREHENSIVE METABOLIC PANEL - Abnormal; Notable for the following components:   Glucose, Bld 108 (*)    BUN 38 (*)    Creatinine, Ser 2.46 (*)    Calcium 8.4 (*)    Total Protein 5.8 (*)    GFR, Estimated 25 (*)    All other components within normal limits  APTT  PROTIME-INR     IMAGES:   EKG: 08/08/2020 Rate 49 bpm  Sinus bradycardia   CV:  Past Medical History:  Diagnosis Date  . Anemia    HEME OCCULT POSITIVE   . Blood transfusion without reported diagnosis   . CAD (coronary artery disease)    THEY FOUND A BLOCKAGE IN 1999 DURNG A CATHERIZATION , NOT ENOUGH TO  NEED A STENT , THEY JUST CLEANED ME OUT   . Cancer  [facility])    SKIN CANCER   . Chronic kidney disease   . Colon cancer ([place])   . Colon polyps 03/2004  . Diverticulosis   . 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 Dyspnea    ON EXERTION   . Elevated cholesterol   . Family history of breast cancer   . Family history of esophageal cancer   . Family history of pancreatic cancer   . High blood pressure   . History of colon polyps   . History of migraine    childhood    Past Surgical History:  Procedure Laterality Date  . ANGIOPLASTY  1999  . APPENDECTOMY    . CATARACT EXTRACTION, BILATERAL    . CATHERIZATION   1999  . CHOLECYSTECTOMY    . COLON SURGERY    . COLONOSCOPY  07/21/2007   adenomatous polyps diverticulosis  . ESOPHAGOGASTRODUODENOSCOPY  02/07/2012   mild gastritis   . LAPAROSCOPIC RIGHT HEMI COLECTOMY Right 10/08/2018   Procedure: LAPAROSCOPIC RIGHT HEMI COLECTOMY ERAS PATHWAY;  Surgeon: Ileana Roup, MD;  Location: [facility];  Service: General;  Laterality: Right;  . NECK SURGERY     Fatty lipoma  . SIGMOIDOSCOPY  07/06/2009   diverticulosis  .  TONSILLECTOMY     as a child  . UPPER GASTROINTESTINAL ENDOSCOPY      MEDICATIONS: . aspirin EC 81 MG tablet  . atenolol (TENORMIN) 25 MG tablet  . atorvastatin (LIPITOR) 40 MG tablet  . Lutein 40 MG CAPS  . Multiple Vitamins-Minerals (PRESERVISION AREDS PO)   No current facility-administered medications for this encounter.    Konrad Felix, PA-C [facility] Pre-Surgical Testing [phone]

## 2020-08-09 ENCOUNTER — Ambulatory Visit (HOSPITAL_COMMUNITY): Payer: Medicare Other | Admitting: Physician Assistant

## 2020-08-09 ENCOUNTER — Ambulatory Visit (HOSPITAL_COMMUNITY): Payer: Medicare Other | Admitting: Anesthesiology

## 2020-08-09 ENCOUNTER — Encounter (HOSPITAL_COMMUNITY)
Admission: RE | Disposition: A | Discharge status: Home / Self Care | Payer: Self-pay | Source: Home / Self Care | Attending: Surgery

## 2020-08-09 ENCOUNTER — Ambulatory Visit (HOSPITAL_COMMUNITY): Payer: Medicare Other

## 2020-08-09 ENCOUNTER — Encounter: Payer: Self-pay | Admitting: *Deleted

## 2020-08-09 ENCOUNTER — Encounter (HOSPITAL_COMMUNITY): Payer: Self-pay | Admitting: Surgery

## 2020-08-09 ENCOUNTER — Other Ambulatory Visit: Payer: Self-pay

## 2020-08-09 ENCOUNTER — Ambulatory Visit (HOSPITAL_COMMUNITY)
Admission: RE | Admit: 2020-08-09 | Discharge: 2020-08-09 | Disposition: A | Discharge status: Home / Self Care | Payer: Medicare Other | Attending: Surgery | Admitting: Surgery

## 2020-08-09 DIAGNOSIS — Z9049 Acquired absence of other specified parts of digestive tract: Secondary | ICD-10-CM | POA: Insufficient documentation

## 2020-08-09 DIAGNOSIS — C189 Malignant neoplasm of colon, unspecified: Secondary | ICD-10-CM | POA: Insufficient documentation

## 2020-08-09 DIAGNOSIS — Z808 Family history of malignant neoplasm of other organs or systems: Secondary | ICD-10-CM | POA: Insufficient documentation

## 2020-08-09 DIAGNOSIS — Z803 Family history of malignant neoplasm of breast: Secondary | ICD-10-CM | POA: Insufficient documentation

## 2020-08-09 DIAGNOSIS — Z95828 Presence of other vascular implants and grafts: Secondary | ICD-10-CM

## 2020-08-09 DIAGNOSIS — Z801 Family history of malignant neoplasm of trachea, bronchus and lung: Secondary | ICD-10-CM | POA: Insufficient documentation

## 2020-08-09 HISTORY — PX: PORTACATH PLACEMENT: SHX2246

## 2020-08-09 SURGERY — INSERTION, TUNNELED CENTRAL VENOUS DEVICE, WITH PORT
Anesthesia: General | Site: Neck | Laterality: Right

## 2020-08-09 MED ORDER — LIDOCAINE-EPINEPHRINE 1 %-1:100000 IJ SOLN
INTRAMUSCULAR | Status: AC
  Filled 2020-08-09: qty 1

## 2020-08-09 MED ORDER — HEPARIN SOD (PORK) LOCK FLUSH 100 UNIT/ML IV SOLN
INTRAVENOUS | Status: AC
  Filled 2020-08-09: qty 5

## 2020-08-09 MED ORDER — LIDOCAINE-EPINEPHRINE 1 %-1:100000 IJ SOLN
INTRAMUSCULAR | Status: DC | PRN
  Administered 2020-08-09: 8 mL

## 2020-08-09 MED ORDER — HEPARIN SOD (PORK) LOCK FLUSH 100 UNIT/ML IV SOLN
INTRAVENOUS | Status: DC | PRN
  Administered 2020-08-09: 500 [IU] via INTRAVENOUS

## 2020-08-09 MED ORDER — LIDOCAINE HCL (PF) 2 % IJ SOLN
INTRAMUSCULAR | Status: AC
  Filled 2020-08-09: qty 5

## 2020-08-09 MED ORDER — ACETAMINOPHEN 500 MG PO TABS
1000.0000 mg | ORAL_TABLET | ORAL | Status: AC
  Administered 2020-08-09: 1000 mg via ORAL
  Filled 2020-08-09: qty 2

## 2020-08-09 MED ORDER — PROPOFOL 10 MG/ML IV BOLUS
INTRAVENOUS | Status: AC
  Filled 2020-08-09: qty 20

## 2020-08-09 MED ORDER — CHLORHEXIDINE GLUCONATE CLOTH 2 % EX PADS: 6.0000 | MEDICATED_PAD | Freq: Once | CUTANEOUS | Status: DC

## 2020-08-09 MED ORDER — ONDANSETRON HCL 4 MG/2ML IJ SOLN
INTRAMUSCULAR | Status: DC | PRN
  Administered 2020-08-09: 4 mg via INTRAVENOUS

## 2020-08-09 MED ORDER — LIDOCAINE 2% (20 MG/ML) 5 ML SYRINGE
INTRAMUSCULAR | Status: DC | PRN
  Administered 2020-08-09: 40 mg via INTRAVENOUS

## 2020-08-09 MED ORDER — CHLORHEXIDINE GLUCONATE 0.12 % MT SOLN
15.0000 mL | Freq: Once | OROMUCOSAL | Status: AC
  Administered 2020-08-09: 15 mL via OROMUCOSAL

## 2020-08-09 MED ORDER — SODIUM CHLORIDE 0.9 % IV SOLN
Freq: Once | INTRAVENOUS | Status: AC
  Administered 2020-08-09: 14:00:00 20 mL
  Filled 2020-08-09: qty 1.2

## 2020-08-09 MED ORDER — LACTATED RINGERS IV SOLN: INTRAVENOUS | Status: DC

## 2020-08-09 MED ORDER — DEXAMETHASONE SODIUM PHOSPHATE 10 MG/ML IJ SOLN
INTRAMUSCULAR | Status: DC | PRN
  Administered 2020-08-09: 5 mg via INTRAVENOUS

## 2020-08-09 MED ORDER — CEFAZOLIN SODIUM-DEXTROSE 2-4 GM/100ML-% IV SOLN
2.0000 g | INTRAVENOUS | Status: AC
  Administered 2020-08-09: 2 g via INTRAVENOUS
  Filled 2020-08-09: qty 100

## 2020-08-09 MED ORDER — TRAMADOL HCL 50 MG PO TABS
50.0000 mg | ORAL_TABLET | Freq: Four times a day (QID) | ORAL | 0 refills | Status: AC | PRN
Start: 2020-08-09 — End: 2020-08-14

## 2020-08-09 MED ORDER — ORAL CARE MOUTH RINSE: 15.0000 mL | Freq: Once | OROMUCOSAL | Status: AC

## 2020-08-09 MED ORDER — FENTANYL CITRATE (PF) 100 MCG/2ML IJ SOLN
INTRAMUSCULAR | Status: DC | PRN
  Administered 2020-08-09 (×2): 25 ug via INTRAVENOUS

## 2020-08-09 MED ORDER — IOHEXOL 300 MG/ML  SOLN
INTRAMUSCULAR | Status: DC | PRN
  Administered 2020-08-09: 4 mL

## 2020-08-09 MED ORDER — FENTANYL CITRATE (PF) 100 MCG/2ML IJ SOLN: 25.0000 ug | INTRAMUSCULAR | Status: DC | PRN

## 2020-08-09 MED ORDER — PROPOFOL 10 MG/ML IV BOLUS
INTRAVENOUS | Status: DC | PRN
  Administered 2020-08-09: 130 mg via INTRAVENOUS

## 2020-08-09 MED ORDER — FENTANYL CITRATE (PF) 100 MCG/2ML IJ SOLN
INTRAMUSCULAR | Status: AC
  Filled 2020-08-09: qty 2

## 2020-08-09 SURGICAL SUPPLY — 47 items
ADH SKN CLS APL DERMABOND .7 (GAUZE/BANDAGES/DRESSINGS) ×1
APL PRP STRL LF DISP 70% ISPRP (MISCELLANEOUS) ×1
APL SKNCLS STERI-STRIP NONHPOA (GAUZE/BANDAGES/DRESSINGS) ×1
BAG DECANTER FOR FLEXI CONT (MISCELLANEOUS) ×3 IMPLANT
BENZOIN TINCTURE PRP APPL 2/3 (GAUZE/BANDAGES/DRESSINGS) ×3 IMPLANT
BLADE HEX COATED 2.75 (ELECTRODE) ×3 IMPLANT
BLADE SURG 15 STRL LF DISP TIS (BLADE) ×1 IMPLANT
BLADE SURG 15 STRL SS (BLADE) ×3
BLADE SURG SZ11 CARB STEEL (BLADE) ×3 IMPLANT
CHLORAPREP W/TINT 26 (MISCELLANEOUS) ×3 IMPLANT
CLOSURE WOUND 1/2 X4 (GAUZE/BANDAGES/DRESSINGS) ×1
COVER PROBE U/S 5X48 (MISCELLANEOUS) ×3 IMPLANT
COVER WAND RF STERILE (DRAPES) IMPLANT
DECANTER SPIKE VIAL GLASS SM (MISCELLANEOUS) ×3 IMPLANT
DERMABOND ADVANCED (GAUZE/BANDAGES/DRESSINGS) ×2
DERMABOND ADVANCED .7 DNX12 (GAUZE/BANDAGES/DRESSINGS) IMPLANT
DRAPE C-ARM 42X120 X-RAY (DRAPES) ×3 IMPLANT
DRAPE LAPAROSCOPIC ABDOMINAL (DRAPES) ×3 IMPLANT
DRSG TEGADERM 2-3/8X2-3/4 SM (GAUZE/BANDAGES/DRESSINGS) ×3 IMPLANT
DRSG TEGADERM 4X4.75 (GAUZE/BANDAGES/DRESSINGS) ×3 IMPLANT
ELECT REM PT RETURN 15FT ADLT (MISCELLANEOUS) ×3 IMPLANT
GAUZE 4X4 16PLY RFD (DISPOSABLE) ×3 IMPLANT
GAUZE SPONGE 4X4 12PLY STRL (GAUZE/BANDAGES/DRESSINGS) ×3 IMPLANT
GLOVE ECLIPSE 8.0 STRL XLNG CF (GLOVE) ×3 IMPLANT
GLOVE INDICATOR 8.0 STRL GRN (GLOVE) ×3 IMPLANT
GOWN STRL REUS W/TWL XL LVL3 (GOWN DISPOSABLE) ×6 IMPLANT
KIT BASIN OR (CUSTOM PROCEDURE TRAY) ×3 IMPLANT
KIT PORT POWER 8FR ISP CVUE (Port) ×3 IMPLANT
KIT TURNOVER KIT A (KITS) IMPLANT
NDL HYPO 25X1 1.5 SAFETY (NEEDLE) ×1 IMPLANT
NEEDLE HYPO 25X1 1.5 SAFETY (NEEDLE) ×3 IMPLANT
NS IRRIG 1000ML POUR BTL (IV SOLUTION) ×3 IMPLANT
PACK BASIC VI WITH GOWN DISP (CUSTOM PROCEDURE TRAY) ×3 IMPLANT
PENCIL SMOKE EVACUATOR (MISCELLANEOUS) IMPLANT
STRIP CLOSURE SKIN 1/2X4 (GAUZE/BANDAGES/DRESSINGS) ×2 IMPLANT
SUT MNCRL AB 4-0 PS2 18 (SUTURE) ×3 IMPLANT
SUT PROLENE 2 0 SH DA (SUTURE) ×3 IMPLANT
SUT VIC AB 2-0 SH 18 (SUTURE) IMPLANT
SUT VIC AB 2-0 SH 27 (SUTURE)
SUT VIC AB 2-0 SH 27X BRD (SUTURE) IMPLANT
SUT VIC AB 3-0 SH 27 (SUTURE) ×3
SUT VIC AB 3-0 SH 27XBRD (SUTURE) ×1 IMPLANT
SYR 10ML LL (SYRINGE) ×3 IMPLANT
SYR 20ML LL LF (SYRINGE) ×3 IMPLANT
SYR CONTROL 10ML LL (SYRINGE) ×3 IMPLANT
TOWEL OR 17X26 10 PK STRL BLUE (TOWEL DISPOSABLE) ×3 IMPLANT
TOWEL OR NON WOVEN STRL DISP B (DISPOSABLE) ×3 IMPLANT

## 2020-08-09 NOTE — Progress Notes (Signed)
[facility] Psychosocial Distress Screening Clinical Social Work  Clinical Social Work was referred by distress screening protocol.  The patient scored a 5 on the Psychosocial Distress Thermometer which indicates moderate distress. Clinical Social Worker contacted patient by phone to assess for distress and other psychosocial needs.  CSW left a voicemail offering support and information on the support team and support services at [facility].  CSW provided contact information and encouraged patient to call with questions or concerns.     ONCBCN DISTRESS SCREENING 08/08/2020  Screening Type Initial Screening  Distress experienced in past week (1-10) 5  Emotional problem type Adjusting to illness  Physical Problem type Skin dry/itchy    Johnnye Lana, MSW, LCSW, OSW-C Clinical Social Worker [place] [phone]

## 2020-08-09 NOTE — Anesthesia Procedure Notes (Signed)
Procedure Name: LMA Insertion Date/Time: 08/09/2020 1:21 PM Performed by: Lollie Sails, CRNA Pre-anesthesia Checklist: Patient identified, Emergency Drugs available, Suction available, Patient being monitored and Timeout performed Patient Re-evaluated:Patient Re-evaluated prior to induction Oxygen Delivery Method: Circle system utilized Preoxygenation: Pre-oxygenation with 100% oxygen Induction Type: IV induction Ventilation: Mask ventilation without difficulty LMA: LMA inserted LMA Size: 4.0 Number of attempts: 1 Placement Confirmation: positive ETCO2 and breath sounds checked- equal and bilateral Tube secured with: Tape Dental Injury: Teeth and Oropharynx as per pre-operative assessment

## 2020-08-09 NOTE — Anesthesia Preprocedure Evaluation (Addendum)
Anesthesia Evaluation  Patient identified by MRN, date of birth, ID band Patient awake    Reviewed: Allergy & Precautions, NPO status , Patient's Chart, lab work & pertinent test results, reviewed documented beta blocker date and time   Airway Mallampati: II  TM Distance: >3 FB Neck ROM: Full    Dental  (+) Poor Dentition, Dental Advisory Given   Pulmonary neg pulmonary ROS,    Pulmonary exam normal breath sounds clear to auscultation       Cardiovascular hypertension, Pt. on home beta blockers and Pt. on medications + CAD  Normal cardiovascular exam Rhythm:Regular Rate:Normal     Neuro/Psych negative neurological ROS  negative psych ROS   GI/Hepatic negative GI ROS, Neg liver ROS,   Endo/Other  negative endocrine ROS  Renal/GU Renal InsufficiencyRenal disease  negative genitourinary   Musculoskeletal negative musculoskeletal ROS (+)   Abdominal   Peds  Hematology  (+) Blood dyscrasia (Hgb 9.6), anemia ,   Anesthesia Other Findings   Reproductive/Obstetrics                            Anesthesia Physical Anesthesia Plan  ASA: III  Anesthesia Plan: General   Post-op Pain Management:    Induction: Intravenous  PONV Risk Score and Plan: 2 and Ondansetron and Dexamethasone  Airway Management Planned: LMA  Additional Equipment:   Intra-op Plan:   Post-operative Plan: Extubation in OR  Informed Consent: I have reviewed the patients History and Physical, chart, labs and discussed the procedure including the risks, benefits and alternatives for the proposed anesthesia with the patient or authorized representative who has indicated his/her understanding and acceptance.     Dental advisory given  Plan Discussed with: CRNA  Anesthesia Plan Comments:         Anesthesia Quick Evaluation

## 2020-08-09 NOTE — Op Note (Signed)
08/09/2020  2:33 PM  PATIENT:  Bradley [name]  86 y.o. male  Patient Care Team: Brackman, Saundra Shelling, DO as PCP - General (Internal Medicine) Jackquline Denmark, MD as Consulting Physician (Gastroenterology) Ileana Roup, MD as Consulting Physician (General Surgery) Ladell Pier, MD as Consulting Physician (Oncology)  PRE-OPERATIVE DIAGNOSIS:  Colon cancer; need for infusional chemotherapy  POST-OPERATIVE DIAGNOSIS:  Same  PROCEDURE: 1. Placement of port-a-cath - right internal jugular vein - with fluoroscopy 2. Ultrasound guided access of the right internal jugular vein  SURGEON:  Sharon Mt. Mahmood Boehringer, MD  ASSISTANT: OR staff  ANESTHESIA:   local and general  COUNTS:  Sponge, needle and instrument counts were reported correct x2 at the conclusion of the operation.  EBL: 5 mL  DRAINS: None  SPECIMEN: None  COMPLICATIONS: None  FINDINGS: Right IJ accessed on first attempt. Port-a-cath tip rests at the cavoatrial junction.  DISPOSITION: PACU in satisfactory condition  DESCRIPTION: The patient was identified in preop holding and taken to the OR where he was placed on the operating room table. SCDs were placed. Arms were tucked. General endotracheal anesthesia was induced without difficulty. Hair on the neck and right chest was clipped. He was positioned with bump under his upper back and head slightly rotated to the left. Pressure points were padded. He was then prepped and draped in the usual sterile fashion. A surgical timeout was performed indicating the correct patient, procedure, positioning and need for preoperative antibiotics.   With ultrasound guidance, the right internal jugular vein was identified. This was then accessed with the access needle on the first attempt. The J-wire was advanced.  There was no ectopy on the monitor.  The ultrasound then used to visualize the location of the J-wire and remained within the lumen of the internal jugular vein down below  the clavicle.  Fluoroscopy was also used to confirm that the wire remained on the right side and did not cross midline.  Attention was turned to creating the port site.  An incision was made on the right anterior chest and the pocket was then developed in the subcutaneous space.  Two 2-0 Prolene sutures were then used to anchor the port to the pectoralis fashion.  The peel-away sheath was then attached to the J-wire and under fluoroscopic guidance advanced.  The J-wire was removed.  The port tubing was then introduced.  Dilute Omnipaque contrast was instilled into the tubing to aid in visualization.  Of note, only 2 mL of a dilute mixture were utilized and the vast majority of this was aspirated at completion.  The peel-away sheath was cracked and removed.  The tubing sat within the right atrium.  A tunneling device was then introduced from the port pocket site.  The tubing was then tunneled.  With fluoroscopic guidance, the tubing was then withdrawn until at rest at the cavoatrial junction.  The tubing was then trimmed from the port site area.  This was then attached to the port with the locking device.  The Prolene sutures were then tied.  Huber needle was used to access the port and the port freely aspirated blood.  Saline was used to flush the port which occurred without difficulty.  The port was then "locked" with heparin.  Hemostasis was present.  2-0 Vicryl deep dermal sutures were used to close the port site. Sponge, needle and instrument counts were reported correct x2.  The skin was then closed with a running 4-0 Monocryl subcuticular suture.  A single  suture was placed at the neck access site carefully.  The wounds were washed.  Dermabond was placed.  He was then awakened from anesthesia, extubated, transferred to a stretcher for transport to PACU in satisfactory condition.

## 2020-08-09 NOTE — Transfer of Care (Signed)
Immediate Anesthesia Transfer of Care Note  Patient: Bradley [name]  Procedure(s) Performed: ULTRASOUND GUIDED ACCES OF RIGHT IJ AND INSERTION PORT-A-CATH WITH FLUEROSCOPY (Right Neck)  Patient Location: PACU  Anesthesia Type:General  Level of Consciousness: awake and patient cooperative  Airway & Oxygen Therapy: Patient Spontanous Breathing and Patient connected to face mask oxygen  Post-op Assessment: Report given to RN and Post -op Vital signs reviewed and stable  Post vital signs: Reviewed and stable  Last Vitals:  Vitals Value Taken Time  BP 169/71 08/09/20 1430  Temp    Pulse 55 08/09/20 1431  Resp 12 08/09/20 1431  SpO2 100 % 08/09/20 1431  Vitals shown include unvalidated device data.  Last Pain:  Vitals:   08/09/20 1250  TempSrc: Oral  PainSc: 0-No pain      Patients Stated Pain Goal: 4 (62/83/66 2947)  Complications: No complications documented.

## 2020-08-09 NOTE — H&P (Signed)
CC: Colon cancer with peritoneal disease; here for port-a-cath placement  HPI: [name] is a very pleasant 86yoM with hx of HTN, HLD, CKD, CAD who underwent a colonoscopy on 08/04/18 with Dr. Lyndel Safe. This was done for heme positive stool. He reports a two-year history of anemia with hemoglobin typically hovering around 10-11. He denies ever having noticed frank blood in his stool or discolored stool. He denies any changes in weight. He denies any abdominal pain. He denies any nausea/vomiting. His colonoscopy was completed 08/04/18 which demonstrated:  1. Fungating mass in the cecum-biopsies returned invasive adenocarcinoma. This was tattooed distal to the lesion. 2. 4 polyps in the transverse colon which were removed-tubular adenomas 3. Polyp in the sigmoid colon which was removed-tubular adenoma  He subsequently underwent CT AP without contrast due to underlying CKD and this demonstrated a mass in the cecum as well as a questionable area of thickening in the hepatic flexure. There was no evidence of metastatic disease in the abdomen/pelvis.   He reports having had a colonoscopy approximately 8 years prior and has a personal history of polyps. He denies any personal or family history of colorectal cancer.  He was taken to the OR 10/08/2018 and underwent laparoscopic right hemicolectomy. The mass was identified in the proximal transverse colon near the hepatic flexure and a tattoo is noted to be distal to this. Cecum appeared relatively normal. No obvious metastatic disease is seen. The hepatic flexure with a mass associated was adherent to the liver and the location of his prior gallbladder surgery but did not involve the liver. He recovered well from this. He was discharged home following a brief postoperative ileus on 10/14/2018. He returns today for follow-up. He has been tolerating a diet, moving his bowels multiple times per day and feeling well. He denies new complaints. He  denies abdominal pain. He denies any fever/chills.  Pathology returned [id number] (0/19); no LVI/PNI. MMR/IHC - preserved expression. His case has been presented at our multispecialty tumor board conference and oncology is considering adjuvant therapy. He has scheduled him for follow-up with them 10/30/2018.  He was offered adjuvant therapy with medical oncology but declined.  Surveillance colonoscopy completed 10/05/2019 with Dr. Lyndel Safe which was only remarkable for a small diminutive tubular adenoma that was removed. CEA 3.95. He has not had surveillance CT chest/abdomen/pelvis yet.  CEA 3.95 11/2019  INTERVAL HX Previously seen in long-term follow-up and doing well Underwent surveillance CT CAP 06/26/20 with Dr. Benay Spice which showed  1. Status post right hemicolectomy. There are multiple new and enlarging soft tissue nodules about the anastomotic site in the upper abdomen, anterior to the stomach and liver as well as within the mesocolon. Findings are consistent with locally recurrent and metastatic disease. 2. No noncontrast evidence of distant metastatic disease directly involving the abdominal organs or in the chest. 3. Trace ascites.  His case was then reviewed at [facility]. 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 We had discussed options going forward and it was the consensus opinion that he would not likely benefit from any further surgery given extent of disease. He met again and everything was reviewed. He is interested in pursuing systemic therapy at this point and a port-a-cath has been recommended.   Past Medical History:  Diagnosis Date  . Anemia    HEME OCCULT POSITIVE   . Blood transfusion without reported diagnosis   . CAD (coronary artery disease)    THEY FOUND A BLOCKAGE IN 1999 DURNG A CATHERIZATION , NOT ENOUGH  TO  NEED A STENT , THEY JUST CLEANED ME OUT   . Cancer [facility])    SKIN CANCER   . Chronic kidney disease   . Colon cancer ([place])   . Colon  polyps 03/2004  . Diverticulosis   . 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 Dyspnea    ON EXERTION   . Elevated cholesterol   . Family history of breast cancer   . Family history of esophageal cancer   . Family history of pancreatic cancer   . High blood pressure   . History of colon polyps   . History of migraine    childhood    Past Surgical History:  Procedure Laterality Date  . ANGIOPLASTY  1999  . APPENDECTOMY    . CATARACT EXTRACTION, BILATERAL    . CATHERIZATION   1999  . CHOLECYSTECTOMY    . COLON SURGERY    . COLONOSCOPY  07/21/2007   adenomatous polyps diverticulosis  . ESOPHAGOGASTRODUODENOSCOPY  02/07/2012   mild gastritis   . LAPAROSCOPIC RIGHT HEMI COLECTOMY Right 10/08/2018   Procedure: LAPAROSCOPIC RIGHT HEMI COLECTOMY ERAS PATHWAY;  Surgeon: Ileana Roup, MD;  Location: [facility]S;  Service: General;  Laterality: Right;  . NECK SURGERY     Fatty lipoma  . SIGMOIDOSCOPY  07/06/2009   diverticulosis  . TONSILLECTOMY     as a child  . UPPER GASTROINTESTINAL ENDOSCOPY      Family History  Problem Relation Age of Onset  . Esophageal cancer Father   . High blood pressure Mother   . Kidney failure Mother   . High blood pressure Sister   . Breast cancer Sister        dx < 49   . Pancreatic cancer Cousin   . Cancer Cousin        unk type  . Cancer Cousin        blood cancer  . Colon cancer Neg Hx   . Rectal cancer Neg Hx   . Stomach cancer Neg Hx     Social:  reports that he has never smoked. He has never used smokeless tobacco. He reports current alcohol use. He reports that he does not use drugs.  Allergies: No Known Allergies  Medications: I have reviewed the patient's current medications.  Results for orders placed or performed during the hospital encounter of 08/08/20 (from the past 48 hour(s))  APTT     Status: None   Collection Time: 08/08/20  9:49 AM  Result Value Ref Range   aPTT 33 24 - 36 seconds    Comment: Performed at [facility], [place] [address], [place], [place] [address]  CBC WITH DIFFERENTIAL     Status: Abnormal   Collection Time: 08/08/20  9:49 AM  Result Value Ref Range   WBC 5.6 4.0 - 10.5 K/uL   RBC 3.48 (L) 4.22 - 5.81 MIL/uL   Hemoglobin 9.6 (L) 13.0 - 17.0 g/dL   HCT 31.4 (L) 39 - 52 %   MCV 90.2 80.0 - 100.0 fL   MCH 27.6 26.0 - 34.0 pg   MCHC 30.6 30.0 - 36.0 g/dL   RDW 22.6 (H) 11.5 - 15.5 %   Platelets 169 150 - 400 K/uL   nRBC 0.0 0.0 - 0.2 %   Neutrophils Relative % 61 %   Neutro Abs 3.4 1.7 - 7.7 K/uL   Lymphocytes Relative 23 %   Lymphs Abs 1.3 0.7 - 4.0 K/uL   Monocytes Relative  10 %   Monocytes Absolute 0.6 0.1 - 1.0 K/uL   Eosinophils Relative 5 %   Eosinophils Absolute 0.3 0.0 - 0.5 K/uL   Basophils Relative 0 %   Basophils Absolute 0.0 0.0 - 0.1 K/uL   Immature Granulocytes 1 %   Abs Immature Granulocytes 0.04 0.00 - 0.07 K/uL   Polychromasia PRESENT     Comment: Performed at [facility], [place] [address], [place], [place] [address]  Comprehensive metabolic panel     Status: Abnormal   Collection Time: 08/08/20  9:49 AM  Result Value Ref Range   Sodium 139 135 - 145 mmol/L   Potassium 4.7 3.5 - 5.1 mmol/L   Chloride 107 98 - 111 mmol/L   CO2 23 22 - 32 mmol/L   Glucose, Bld 108 (H) 70 - 99 mg/dL    Comment: Glucose reference range applies only to samples taken after fasting for at least 8 hours.   BUN 38 (H) 8 - 23 mg/dL   Creatinine, Ser 2.46 (H) 0.61 - 1.24 mg/dL   Calcium 8.4 (L) 8.9 - 10.3 mg/dL   Total Protein 5.8 (L) 6.5 - 8.1 g/dL   Albumin 3.5 3.5 - 5.0 g/dL   AST 25 15 - 41 U/L   ALT 27 0 - 44 U/L   Alkaline Phosphatase 65 38 - 126 U/L   Total Bilirubin 0.6 0.3 - 1.2 mg/dL   GFR, Estimated 25 (L) >60 mL/min    Comment: (NOTE) Calculated using the CKD-EPI Creatinine Equation (2021)    Anion gap 9 5 - 15    Comment: Performed at [facility], [place] [address], [place], [place] [address]  Protime-INR      Status: None   Collection Time: 08/08/20  9:49 AM  Result Value Ref Range   Prothrombin Time 13.2 11.4 - 15.2 seconds   INR 1.0 0.8 - 1.2    Comment: (NOTE) INR goal varies based on device and disease states. Performed at [facility], [place] [address], [place], [place] [address]     No results found.  ROS - all of the below systems have been reviewed with the patient and positives are indicated with bold text General: chills, fever or night sweats Eyes: blurry vision or double vision ENT: epistaxis or sore throat Allergy/Immunology: itchy/watery eyes or nasal congestion Hematologic/Lymphatic: bleeding problems, blood clots or swollen lymph nodes Endocrine: temperature intolerance or unexpected weight changes Breast: new or changing breast lumps or nipple discharge Resp: cough, shortness of breath, or wheezing CV: chest pain or dyspnea on exertion GI: as per HPI GU: dysuria, trouble voiding, or hematuria MSK: joint pain or joint stiffness Neuro: TIA or stroke symptoms Derm: pruritus and skin lesion changes Psych: anxiety and depression  PE There were no vitals taken for this visit. Constitutional: NAD; conversant; no deformities Eyes: Moist conjunctiva; no lid lag; anicteric; PERRL Lungs: Normal respiratory effort CV: RRR; no palpable thrills GI: Abd soft, nontender, nondistended; no palpable hepatosplenomegaly MSK: Normal range of motion of extremities Psychiatric: Appropriate affect; alert and oriented x3 Lymphatic: No palpable cervical or axillary lymphadenopathy  Results for orders placed or performed during the hospital encounter of 08/08/20 (from the past 48 hour(s))  APTT     Status: None   Collection Time: 08/08/20  9:49 AM  Result Value Ref Range   aPTT 33 24 - 36 seconds    Comment: Performed at [facility], [place] [address], [place],  [address]  CBC WITH DIFFERENTIAL     Status: Abnormal   Collection Time:  08/08/20  9:49 AM  Result Value Ref Range   WBC 5.6 4.0 - 10.5 K/uL   RBC 3.48 (L) 4.22 - 5.81 MIL/uL   Hemoglobin 9.6 (L) 13.0 - 17.0 g/dL   HCT 31.4 (L) 39 - 52 %   MCV 90.2 80.0 - 100.0 fL   MCH 27.6 26.0 - 34.0 pg   MCHC 30.6 30.0 - 36.0 g/dL   RDW 22.6 (H) 11.5 - 15.5 %   Platelets 169 150 - 400 K/uL   nRBC 0.0 0.0 - 0.2 %   Neutrophils Relative % 61 %   Neutro Abs 3.4 1.7 - 7.7 K/uL   Lymphocytes Relative 23 %   Lymphs Abs 1.3 0.7 - 4.0 K/uL   Monocytes Relative 10 %   Monocytes Absolute 0.6 0.1 - 1.0 K/uL   Eosinophils Relative 5 %   Eosinophils Absolute 0.3 0.0 - 0.5 K/uL   Basophils Relative 0 %   Basophils Absolute 0.0 0.0 - 0.1 K/uL   Immature Granulocytes 1 %   Abs Immature Granulocytes 0.04 0.00 - 0.07 K/uL   Polychromasia PRESENT     Comment: Performed at [facility], [place] [address], [place], [place] [address]  Comprehensive metabolic panel     Status: Abnormal   Collection Time: 08/08/20  9:49 AM  Result Value Ref Range   Sodium 139 135 - 145 mmol/L   Potassium 4.7 3.5 - 5.1 mmol/L   Chloride 107 98 - 111 mmol/L   CO2 23 22 - 32 mmol/L   Glucose, Bld 108 (H) 70 - 99 mg/dL    Comment: Glucose reference range applies only to samples taken after fasting for at least 8 hours.   BUN 38 (H) 8 - 23 mg/dL   Creatinine, Ser 2.46 (H) 0.61 - 1.24 mg/dL   Calcium 8.4 (L) 8.9 - 10.3 mg/dL   Total Protein 5.8 (L) 6.5 - 8.1 g/dL   Albumin 3.5 3.5 - 5.0 g/dL   AST 25 15 - 41 U/L   ALT 27 0 - 44 U/L   Alkaline Phosphatase 65 38 - 126 U/L   Total Bilirubin 0.6 0.3 - 1.2 mg/dL   GFR, Estimated 25 (L) >60 mL/min    Comment: (NOTE) Calculated using the CKD-EPI Creatinine Equation (2021)    Anion gap 9 5 - 15    Comment: Performed at [facility], [place] [address] [place], [place] [address]  Protime-INR     Status: None   Collection Time: 08/08/20  9:49 AM  Result Value Ref Range   Prothrombin Time 13.2 11.4 - 15.2 seconds   INR 1.0  0.8 - 1.2    Comment: (NOTE) INR goal varies based on device and disease states. Performed at [facility], [place] [address], [place], [place] [address]    A/P: Bradley [name] is an 86 y.o. male with hx of HTN, HLD, CKD, CAD - prior right hemicolectomy for adenocarcinoma with now recurrent disease and concerns for likely peritoneal carcinomatosis - multiple thickened loops of bowel, multifocal disease around anastomosis as well as mesocolon - about to initiate systemic therapy  -We reviewed the relevant anatomy in the neck and upper chest. We discussed the technical aspects of port-a-cath placement - accessing jugular vs subclavian vasculature, etc. -The planned procedure, material risks (including, but not limited to, pain, bleeding, pneumothorax, hemothorax, need for chest tube placement,  need for  blood transfusion, damage to surrounding structures including arteries, stroke, need for additional procedures, worsening of pre-existing medical conditions including kidney function, malposition of port/need for replacement, pneumonia, heart attack, stroke, death) benefits and alternatives to surgery were discussed at length. The patient's questions were answered to his satisfaction, he voiced understanding and elected to proceed with surgery. Additionally, we discussed typical postoperative expectations and the recovery process.  Sharon Mt. Dema Severin, M.D. [facility] Use [email or web address] to contact on call provider

## 2020-08-09 NOTE — Discharge Instructions (Signed)
POST OP INSTRUCTIONS  1. DIET: As tolerated. Follow a light bland diet the first 24 hours after arrival home, such as soup, liquids, crackers, etc.  Be sure to include lots of fluids daily.  Avoid fast food or heavy meals as your are more likely to get nauseated.  Eat a low fat the next few days after surgery.  2. Take your usually prescribed home medications unless otherwise directed.  3. PAIN CONTROL: a. Pain is best controlled by a usual combination of three different methods TOGETHER: i. Ice/Heat ii. Over the counter pain medication iii. Prescription pain medication b. Most patients will experience some swelling and bruising around the surgical site.  Ice packs or heating pads (30-60 minutes up to 6 times a day) will help. Some people prefer to use ice alone, heat alone, alternating between ice & heat.  Experiment to what works for you.  Swelling and bruising can take several weeks to resolve.   c. It is helpful to take an over-the-counter pain medication regularly for the first few weeks: i. Acetaminophen (Tylenol) - you may take 650mg  every 6 hours as needed. You can take this with motrin as they act differently on the body. If you are taking a narcotic pain medication that has acetaminophen in it, do not take over the counter tylenol at the same time. d. A  prescription for pain medication should be given to you upon discharge.  Take your pain medication as prescribed if your pain is not adequatly controlled with the over-the-counter pain reliefs mentioned above.  4. Avoid getting constipated.  Between the surgery and the pain medications, it is common to experience some constipation.  Increasing fluid intake and taking a fiber supplement (such as Metamucil, Citrucel, FiberCon, MiraLax, etc) 1-2 times a day regularly will usually help prevent this problem from occurring.  A mild laxative (prune juice, Milk of Magnesia, MiraLax, etc) should be taken according to package directions if there are  no bowel movements after 48 hours.    5. Dressing: Your incisions are covered in Dermabond which is like sterile superglue for the skin. This will come off on it's own in a couple weeks. It is waterproof and you may bathe normally starting the day after your surgery in a shower. Avoid baths/pools/lakes/oceans until your wounds have fully healed.  6. ACTIVITIES as tolerated:   a. You may resume regular (light) daily activities beginning the next day--such as daily self-care, walking, climbing stairs--gradually increasing activities as tolerated.  If you can walk 30 minutes without difficulty, it is safe to try more intense activity such as jogging, treadmill, bicycling, low-impact aerobics.  b. DO NOT PUSH THROUGH PAIN.  Let pain be your guide: If it hurts to do something, don't do it. c. You may drive when you are no longer taking prescription pain medication, you can comfortably wear a seatbelt, and you can safely maneuver your car and apply brakes.   d. FOLLOW UP in our office if any questions or concerns arise; medical oncology (Dr. Benay Spice) has you scheduled to begin chemotherapy next week. You may follow the instructions they have given you already for port-a-cath care or contact us if you have questions as well  9. If you have disability or family leave forms that need to be completed, you may have them completed by your primary care physician's office; for return to work instructions, please ask our office staff and they will be happy to assist you in obtaining this documentation   When  to call us [phone]: 1. Poor pain control 2. Reactions / problems with new medications (rash/itching, etc)  3. Fever over 101.5 F (38.5 C) 4. Inability to urinate 5. Nausea/vomiting 6. Worsening swelling or bruising 7. Continued bleeding from incision. 8. Increased pain, redness, or drainage from the incision  The clinic staff is available to answer your questions during regular business hours  (8:30am-5pm).  Please don't hesitate to call and ask to speak to one of our nurses for clinical concerns.   A surgeon from [facility] is always on call at the hospitals   If you have a medical emergency, go to the nearest emergency room or call 911.  [facility], [place] [address], [place], [place], [place]  [address] MAIN: [phone] FAX: [phone] [email or web address]

## 2020-08-09 NOTE — Progress Notes (Signed)
Pharmacist Chemotherapy Monitoring - Initial Assessment    Anticipated start date: 08/15/20    Regimen:  . Are orders appropriate based on the patient's diagnosis, regimen, and cycle? Yes . Does the plan date match the patient's scheduled date? Yes . Is the sequencing of drugs appropriate? Yes . Are the premedications appropriate for the patient's regimen? Yes . Prior Authorization for treatment is: Approved o If applicable, is the correct biosimilar selected based on the patient's insurance? not applicable  Organ Function and Labs: Marland Kitchen Are dose adjustments needed based on the patient's renal function, hepatic function, or hematologic function? Yes  - 08/08/20 SCr = 2.46; CrCl = 22 mL/min.  Oxali dose reduction needs to be considered.  I've notified Dr. Benay Spice by inbasket message. . Are appropriate labs ordered prior to the start of patient's treatment? Yes . Other organ system assessment, if indicated: N/A . The following baseline labs, if indicated, have been ordered: N/A  Dose Assessment: . Are the drug doses appropriate? Yes . Are the following correct: o Drug concentrations Yes o IV fluid compatible with drug Yes o Administration routes Yes o Timing of therapy Yes . If applicable, does the patient have documented access for treatment and/or plans for port-a-cath placement? yes . If applicable, have lifetime cumulative doses been properly documented and assessed? not applicable Lifetime Dose Tracking  No doses have been documented on this patient for the following tracked chemicals: Doxorubicin, Epirubicin, Idarubicin, Daunorubicin, Mitoxantrone, Bleomycin, Oxaliplatin, Carboplatin, Liposomal Doxorubicin  o   Toxicity Monitoring/Prevention: . The patient has the following take home antiemetics prescribed: Need to confirm pt has home antiemetics Rx'd . The patient has the following take home medications prescribed: N/A . Medication allergies and previous infusion related  reactions, if applicable, have been reviewed and addressed. Yes . The patient's current medication list has been assessed for drug-drug interactions with their chemotherapy regimen. no significant drug-drug interactions were identified on review.  Order Review: . Are the treatment plan orders signed? No . Is the patient scheduled to see a provider prior to their treatment? Yes  I verify that I have reviewed each item in the above checklist and answered each question accordingly.   Kennith Center, Pharm.D., CPP 08/09/2020@12 :05 PM

## 2020-08-10 ENCOUNTER — Other Ambulatory Visit (HOSPITAL_COMMUNITY): Payer: Medicare Other

## 2020-08-10 ENCOUNTER — Ambulatory Visit (HOSPITAL_COMMUNITY): Payer: Medicare Other

## 2020-08-10 ENCOUNTER — Encounter (HOSPITAL_COMMUNITY): Payer: Self-pay | Admitting: Surgery

## 2020-08-10 NOTE — Anesthesia Postprocedure Evaluation (Signed)
Anesthesia Post Note  Patient: Bradley [name]  Procedure(s) Performed: ULTRASOUND GUIDED ACCES OF RIGHT IJ AND INSERTION PORT-A-CATH WITH FLUEROSCOPY (Right Neck)     Patient location during evaluation: PACU Anesthesia Type: General Level of consciousness: awake and alert Pain management: pain level controlled Vital Signs Assessment: post-procedure vital signs reviewed and stable Respiratory status: spontaneous breathing, nonlabored ventilation, respiratory function stable and patient connected to nasal cannula oxygen Cardiovascular status: blood pressure returned to baseline and stable Postop Assessment: no apparent nausea or vomiting Anesthetic complications: no   No complications documented.  Last Vitals:  Vitals:   08/09/20 1500 08/09/20 1505  BP:  (!) 146/53  Pulse: (!) 55 (!) 51  Resp: 14 16  Temp: 36.6 C 36.4 C  SpO2: 98% 100%    Last Pain:  Vitals:   08/09/20 1505  TempSrc:   PainSc: 0-No pain                 Yvonda Fouty L Theron [place]

## 2020-08-13 ENCOUNTER — Other Ambulatory Visit: Payer: Self-pay | Admitting: Oncology

## 2020-08-15 ENCOUNTER — Inpatient Hospital Stay: Payer: Medicare Other

## 2020-08-15 ENCOUNTER — Other Ambulatory Visit: Payer: Self-pay

## 2020-08-15 ENCOUNTER — Inpatient Hospital Stay (HOSPITAL_BASED_OUTPATIENT_CLINIC_OR_DEPARTMENT_OTHER): Payer: Medicare Other | Admitting: Oncology

## 2020-08-15 ENCOUNTER — Other Ambulatory Visit: Payer: Self-pay | Admitting: *Deleted

## 2020-08-15 VITALS — BP 135/49 | HR 57 | Temp 99.1°F | Resp 16 | Ht 69.0 in | Wt 165.4 lb

## 2020-08-15 DIAGNOSIS — Z5111 Encounter for antineoplastic chemotherapy: Secondary | ICD-10-CM | POA: Diagnosis not present

## 2020-08-15 DIAGNOSIS — C182 Malignant neoplasm of ascending colon: Secondary | ICD-10-CM

## 2020-08-15 LAB — CBC WITH DIFFERENTIAL (CANCER CENTER ONLY)
Abs Immature Granulocytes: 0.04 10*3/uL (ref 0.00–0.07)
Basophils Absolute: 0 10*3/uL (ref 0.0–0.1)
Basophils Relative: 0 %
Eosinophils Absolute: 0 10*3/uL (ref 0.0–0.5)
Eosinophils Relative: 0 %
HCT: 31.1 % — ABNORMAL LOW (ref 39.0–52.0)
Hemoglobin: 9.7 g/dL — ABNORMAL LOW (ref 13.0–17.0)
Immature Granulocytes: 0 %
Lymphocytes Relative: 10 %
Lymphs Abs: 0.9 10*3/uL (ref 0.7–4.0)
MCH: 27.6 pg (ref 26.0–34.0)
MCHC: 31.2 g/dL (ref 30.0–36.0)
MCV: 88.4 fL (ref 80.0–100.0)
Monocytes Absolute: 0.5 10*3/uL (ref 0.1–1.0)
Monocytes Relative: 6 %
Neutro Abs: 7.9 10*3/uL — ABNORMAL HIGH (ref 1.7–7.7)
Neutrophils Relative %: 84 %
Platelet Count: 194 10*3/uL (ref 150–400)
RBC: 3.52 MIL/uL — ABNORMAL LOW (ref 4.22–5.81)
RDW: 22.5 % — ABNORMAL HIGH (ref 11.5–15.5)
WBC Count: 9.4 10*3/uL (ref 4.0–10.5)
nRBC: 0 % (ref 0.0–0.2)

## 2020-08-15 LAB — CMP (CANCER CENTER ONLY)
ALT: 22 U/L (ref 0–44)
AST: 20 U/L (ref 15–41)
Albumin: 3.1 g/dL — ABNORMAL LOW (ref 3.5–5.0)
Alkaline Phosphatase: 76 U/L (ref 38–126)
Anion gap: 7 (ref 5–15)
BUN: 35 mg/dL — ABNORMAL HIGH (ref 8–23)
CO2: 23 mmol/L (ref 22–32)
Calcium: 8.5 mg/dL — ABNORMAL LOW (ref 8.9–10.3)
Chloride: 109 mmol/L (ref 98–111)
Creatinine: 2.64 mg/dL — ABNORMAL HIGH (ref 0.61–1.24)
GFR, Estimated: 23 mL/min — ABNORMAL LOW (ref >60)
Glucose, Bld: 183 mg/dL — ABNORMAL HIGH (ref 70–99)
Potassium: 5.1 mmol/L (ref 3.5–5.1)
Sodium: 139 mmol/L (ref 135–145)
Total Bilirubin: 0.6 mg/dL (ref 0.3–1.2)
Total Protein: 6.2 g/dL — ABNORMAL LOW (ref 6.5–8.1)

## 2020-08-15 LAB — CEA (IN HOUSE-CHCC): CEA (CHCC-In House): 18.87 ng/mL — ABNORMAL HIGH (ref 0.00–5.00)

## 2020-08-15 MED ORDER — SODIUM CHLORIDE 0.9 % IV SOLN
10.0000 mg | Freq: Once | INTRAVENOUS | Status: AC
  Administered 2020-08-15: 11:00:00 10 mg via INTRAVENOUS
  Filled 2020-08-15: qty 10

## 2020-08-15 MED ORDER — DEXTROSE 5 % IV SOLN
Freq: Once | INTRAVENOUS | Status: AC
  Filled 2020-08-15: qty 250

## 2020-08-15 MED ORDER — PALONOSETRON HCL INJECTION 0.25 MG/5ML
0.2500 mg | Freq: Once | INTRAVENOUS | Status: AC
  Administered 2020-08-15: 11:00:00 0.25 mg via INTRAVENOUS

## 2020-08-15 MED ORDER — SODIUM CHLORIDE 0.9 % IV SOLN
2000.0000 mg/m2 | INTRAVENOUS | Status: AC
  Administered 2020-08-15: 14:00:00 3800 mg via INTRAVENOUS
  Filled 2020-08-15: qty 76

## 2020-08-15 MED ORDER — OXALIPLATIN CHEMO INJECTION 100 MG/20ML
65.0000 mg/m2 | Freq: Once | INTRAVENOUS | Status: AC
  Administered 2020-08-15: 12:00:00 125 mg via INTRAVENOUS
  Filled 2020-08-15: qty 10

## 2020-08-15 MED ORDER — LEUCOVORIN CALCIUM INJECTION 350 MG
400.0000 mg/m2 | Freq: Once | INTRAVENOUS | Status: AC
  Administered 2020-08-15: 12:00:00 764 mg via INTRAVENOUS
  Filled 2020-08-15: qty 38.2

## 2020-08-15 MED ORDER — PALONOSETRON HCL INJECTION 0.25 MG/5ML
INTRAVENOUS | Status: AC
  Filled 2020-08-15: qty 5

## 2020-08-15 MED ORDER — ONDANSETRON HCL 8 MG PO TABS: 8.0000 mg | ORAL_TABLET | Freq: Three times a day (TID) | ORAL | 1 refills | Status: DC | PRN

## 2020-08-15 MED ORDER — LIDOCAINE-PRILOCAINE 2.5-2.5 % EX CREA: 1.0000 "application " | TOPICAL_CREAM | CUTANEOUS | 1 refills | Status: DC

## 2020-08-15 MED ORDER — PROCHLORPERAZINE MALEATE 5 MG PO TABS
5.0000 mg | ORAL_TABLET | Freq: Four times a day (QID) | ORAL | 1 refills | Status: DC | PRN
Start: 2020-08-15 — End: 2020-09-07

## 2020-08-15 NOTE — Patient Instructions (Signed)
[facility] Discharge Instructions for Patients Receiving Chemotherapy  Today you received the following chemotherapy agents Oxaliplain, Leucovorin, and 5FU  To help prevent nausea and vomiting after your treatment, we encourage you to take your nausea medication as directed   If you develop nausea and vomiting that is not controlled by your nausea medication, call the clinic.   BELOW ARE SYMPTOMS THAT SHOULD BE REPORTED IMMEDIATELY:  *FEVER GREATER THAN 100.5 F  *CHILLS WITH OR WITHOUT FEVER  NAUSEA AND VOMITING THAT IS NOT CONTROLLED WITH YOUR NAUSEA MEDICATION  *UNUSUAL SHORTNESS OF BREATH  *UNUSUAL BRUISING OR BLEEDING  TENDERNESS IN MOUTH AND THROAT WITH OR WITHOUT PRESENCE OF ULCERS  *URINARY PROBLEMS  *BOWEL PROBLEMS  UNUSUAL RASH Items with * indicate a potential emergency and should be followed up as soon as possible.  Feel free to call the clinic should you have any questions or concerns. The clinic phone number is [phone].  Please show the [place] at check-in to the Emergency Department and triage nurse.  Oxaliplatin Injection What is this medicine? OXALIPLATIN (ox AL i PLA tin) is a chemotherapy drug. It targets fast dividing cells, like cancer cells, and causes these cells to die. This medicine is used to treat cancers of the colon and rectum, and many other cancers. This medicine may be used for other purposes; ask your health care provider or pharmacist if you have questions. COMMON BRAND NAME(S): Eloxatin What should I tell my health care provider before I take this medicine? They need to know if you have any of these conditions:  heart disease  history of irregular heartbeat  liver disease  low blood counts, like white cells, platelets, or red blood cells  lung or breathing disease, like asthma  take medicines that treat or prevent blood clots  tingling of the fingers or toes, or other nerve disorder  an unusual or  allergic reaction to oxaliplatin, other chemotherapy, other medicines, foods, dyes, or preservatives  pregnant or trying to get pregnant  breast-feeding How should I use this medicine? This drug is given as an infusion into a vein. It is administered in a hospital or clinic by a specially trained health care professional. Talk to your pediatrician regarding the use of this medicine in children. Special care may be needed. Overdosage: If you think you have taken too much of this medicine contact a poison control center or emergency room at once. NOTE: This medicine is only for you. Do not share this medicine with others. What if I miss a dose? It is important not to miss a dose. Call your doctor or health care professional if you are unable to keep an appointment. What may interact with this medicine? Do not take this medicine with any of the following medications:  cisapride  dronedarone  pimozide  thioridazine This medicine may also interact with the following medications:  aspirin and aspirin-like medicines  certain medicines that treat or prevent blood clots like warfarin, apixaban, dabigatran, and rivaroxaban  cisplatin  cyclosporine  diuretics  medicines for infection like acyclovir, adefovir, amphotericin B, bacitracin, cidofovir, foscarnet, ganciclovir, gentamicin, pentamidine, vancomycin  NSAIDs, medicines for pain and inflammation, like ibuprofen or naproxen  other medicines that prolong the QT interval (an abnormal heart rhythm)  pamidronate  zoledronic acid This list may not describe all possible interactions. Give your health care provider a list of all the medicines, herbs, non-prescription drugs, or dietary supplements you use. Also tell them if you smoke, drink  alcohol, or use illegal drugs. Some items may interact with your medicine. What should I watch for while using this medicine? Your condition will be monitored carefully while you are receiving this  medicine. You may need blood work done while you are taking this medicine. This medicine may make you feel generally unwell. This is not uncommon as chemotherapy can affect healthy cells as well as cancer cells. Report any side effects. Continue your course of treatment even though you feel ill unless your healthcare professional tells you to stop. This medicine can make you more sensitive to cold. Do not drink cold drinks or use ice. Cover exposed skin before coming in contact with cold temperatures or cold objects. When out in cold weather wear warm clothing and cover your mouth and nose to warm the air that goes into your lungs. Tell your doctor if you get sensitive to the cold. Do not become pregnant while taking this medicine or for 9 months after stopping it. Women should inform their health care professional if they wish to become pregnant or think they might be pregnant. Men should not father a child while taking this medicine and for 6 months after stopping it. There is potential for serious side effects to an unborn child. Talk to your health care professional for more information. Do not breast-feed a child while taking this medicine or for 3 months after stopping it. This medicine has caused ovarian failure in some women. This medicine may make it more difficult to get pregnant. Talk to your health care professional if you are concerned about your fertility. This medicine has caused decreased sperm counts in some men. This may make it more difficult to father a child. Talk to your health care professional if you are concerned about your fertility. This medicine may increase your risk of getting an infection. Call your health care professional for advice if you get a fever, chills, or sore throat, or other symptoms of a cold or flu. Do not treat yourself. Try to avoid being around people who are sick. Avoid taking medicines that contain aspirin, acetaminophen, ibuprofen, naproxen, or ketoprofen  unless instructed by your health care professional. These medicines may hide a fever. Be careful brushing or flossing your teeth or using a toothpick because you may get an infection or bleed more easily. If you have any dental work done, tell your dentist you are receiving this medicine. What side effects may I notice from receiving this medicine? Side effects that you should report to your doctor or health care professional as soon as possible:  allergic reactions like skin rash, itching or hives, swelling of the face, lips, or tongue  breathing problems  cough  low blood counts - this medicine may decrease the number of white blood cells, red blood cells, and platelets. You may be at increased risk for infections and bleeding  nausea, vomiting  pain, redness, or irritation at site where injected  pain, tingling, numbness in the hands or feet  signs and symptoms of bleeding such as bloody or black, tarry stools; red or dark brown urine; spitting up blood or brown material that looks like coffee grounds; red spots on the skin; unusual bruising or bleeding from the eyes, gums, or nose  signs and symptoms of a dangerous change in heartbeat or heart rhythm like chest pain; dizziness; fast, irregular heartbeat; palpitations; feeling faint or lightheaded; falls  signs and symptoms of infection like fever; chills; cough; sore throat; pain or trouble passing urine  signs and symptoms of liver injury like dark yellow or brown urine; general ill feeling or flu-like symptoms; light-colored stools; loss of appetite; nausea; right upper belly pain; unusually weak or tired; yellowing of the eyes or skin  signs and symptoms of low red blood cells or anemia such as unusually weak or tired; feeling faint or lightheaded; falls  signs and symptoms of muscle injury like dark urine; trouble passing urine or change in the amount of urine; unusually weak or tired; muscle pain; back pain Side effects that  usually do not require medical attention (report to your doctor or health care professional if they continue or are bothersome):  changes in taste  diarrhea  gas  hair loss  loss of appetite  mouth sores This list may not describe all possible side effects. Call your doctor for medical advice about side effects. You may report side effects to FDA at 1-800-FDA-1088. Where should I keep my medicine? This drug is given in a hospital or clinic and will not be stored at home. NOTE: This sheet is a summary. It may not cover all possible information. If you have questions about this medicine, talk to your doctor, pharmacist, or health care provider.  2020 Elsevier/Gold Standard (2019-01-06 12:20:35)  Leucovorin injection What is this medicine? LEUCOVORIN (loo koe VOR in) is used to prevent or treat the harmful effects of some medicines. This medicine is used to treat anemia caused by a low amount of folic acid in the body. It is also used with 5-fluorouracil (5-FU) to treat colon cancer. This medicine may be used for other purposes; ask your health care provider or pharmacist if you have questions. What should I tell my health care provider before I take this medicine? They need to know if you have any of these conditions:  anemia from low levels of vitamin B-12 in the blood  an unusual or allergic reaction to leucovorin, folic acid, other medicines, foods, dyes, or preservatives  pregnant or trying to get pregnant  breast-feeding How should I use this medicine? This medicine is for injection into a muscle or into a vein. It is given by a health care professional in a hospital or clinic setting. Talk to your pediatrician regarding the use of this medicine in children. Special care may be needed. Overdosage: If you think you have taken too much of this medicine contact a poison control center or emergency room at once. NOTE: This medicine is only for you. Do not share this medicine with  others. What if I miss a dose? This does not apply. What may interact with this medicine?  capecitabine  fluorouracil  phenobarbital  phenytoin  primidone  trimethoprim-sulfamethoxazole This list may not describe all possible interactions. Give your health care provider a list of all the medicines, herbs, non-prescription drugs, or dietary supplements you use. Also tell them if you smoke, drink alcohol, or use illegal drugs. Some items may interact with your medicine. What should I watch for while using this medicine? Your condition will be monitored carefully while you are receiving this medicine. This medicine may increase the side effects of 5-fluorouracil, 5-FU. Tell your doctor or health care professional if you have diarrhea or mouth sores that do not get better or that get worse. What side effects may I notice from receiving this medicine? Side effects that you should report to your doctor or health care professional as soon as possible:  allergic reactions like skin rash, itching or hives, swelling of the face,  lips, or tongue  breathing problems  fever, infection  mouth sores  unusual bleeding or bruising  unusually weak or tired Side effects that usually do not require medical attention (report to your doctor or health care professional if they continue or are bothersome):  constipation or diarrhea  loss of appetite  nausea, vomiting This list may not describe all possible side effects. Call your doctor for medical advice about side effects. You may report side effects to FDA at 1-800-FDA-1088. Where should I keep my medicine? This drug is given in a hospital or clinic and will not be stored at home. NOTE: This sheet is a summary. It may not cover all possible information. If you have questions about this medicine, talk to your doctor, pharmacist, or health care provider.  2020 Elsevier/Gold Standard (2008-02-23 16:50:29)   Fluorouracil, 5-FU injection What  is this medicine? FLUOROURACIL, 5-FU (flure oh YOOR a sil) is a chemotherapy drug. It slows the growth of cancer cells. This medicine is used to treat many types of cancer like breast cancer, colon or rectal cancer, pancreatic cancer, and stomach cancer. This medicine may be used for other purposes; ask your health care provider or pharmacist if you have questions. COMMON BRAND NAME(S): Adrucil What should I tell my health care provider before I take this medicine? They need to know if you have any of these conditions:  blood disorders  dihydropyrimidine dehydrogenase (DPD) deficiency  infection (especially a virus infection such as chickenpox, cold sores, or herpes)  kidney disease  liver disease  malnourished, poor nutrition  recent or ongoing radiation therapy  an unusual or allergic reaction to fluorouracil, other chemotherapy, other medicines, foods, dyes, or preservatives  pregnant or trying to get pregnant  breast-feeding How should I use this medicine? This drug is given as an infusion or injection into a vein. It is administered in a hospital or clinic by a specially trained health care professional. Talk to your pediatrician regarding the use of this medicine in children. Special care may be needed. Overdosage: If you think you have taken too much of this medicine contact a poison control center or emergency room at once. NOTE: This medicine is only for you. Do not share this medicine with others. What if I miss a dose? It is important not to miss your dose. Call your doctor or health care professional if you are unable to keep an appointment. What may interact with this medicine?  allopurinol  cimetidine  dapsone  digoxin  hydroxyurea  leucovorin  levamisole  medicines for seizures like ethotoin, fosphenytoin, phenytoin  medicines to increase blood counts like filgrastim, pegfilgrastim, sargramostim  medicines that treat or prevent blood clots like  warfarin, enoxaparin, and dalteparin  methotrexate  metronidazole  pyrimethamine  some other chemotherapy drugs like busulfan, cisplatin, estramustine, vinblastine  trimethoprim  trimetrexate  vaccines Talk to your doctor or health care professional before taking any of these medicines:  acetaminophen  aspirin  ibuprofen  ketoprofen  naproxen This list may not describe all possible interactions. Give your health care provider a list of all the medicines, herbs, non-prescription drugs, or dietary supplements you use. Also tell them if you smoke, drink alcohol, or use illegal drugs. Some items may interact with your medicine. What should I watch for while using this medicine? Visit your doctor for checks on your progress. This drug may make you feel generally unwell. This is not uncommon, as chemotherapy can affect healthy cells as well as cancer cells. Report any  side effects. Continue your course of treatment even though you feel ill unless your doctor tells you to stop. In some cases, you may be given additional medicines to help with side effects. Follow all directions for their use. Call your doctor or health care professional for advice if you get a fever, chills or sore throat, or other symptoms of a cold or flu. Do not treat yourself. This drug decreases your body's ability to fight infections. Try to avoid being around people who are sick. This medicine may increase your risk to bruise or bleed. Call your doctor or health care professional if you notice any unusual bleeding. Be careful brushing and flossing your teeth or using a toothpick because you may get an infection or bleed more easily. If you have any dental work done, tell your dentist you are receiving this medicine. Avoid taking products that contain aspirin, acetaminophen, ibuprofen, naproxen, or ketoprofen unless instructed by your doctor. These medicines may hide a fever. Do not become pregnant while taking this  medicine. Women should inform their doctor if they wish to become pregnant or think they might be pregnant. There is a potential for serious side effects to an unborn child. Talk to your health care professional or pharmacist for more information. Do not breast-feed an infant while taking this medicine. Men should inform their doctor if they wish to father a child. This medicine may lower sperm counts. Do not treat diarrhea with over the counter products. Contact your doctor if you have diarrhea that lasts more than 2 days or if it is severe and watery. This medicine can make you more sensitive to the sun. Keep out of the sun. If you cannot avoid being in the sun, wear protective clothing and use sunscreen. Do not use sun lamps or tanning beds/booths. What side effects may I notice from receiving this medicine? Side effects that you should report to your doctor or health care professional as soon as possible:  allergic reactions like skin rash, itching or hives, swelling of the face, lips, or tongue  low blood counts - this medicine may decrease the number of white blood cells, red blood cells and platelets. You may be at increased risk for infections and bleeding.  signs of infection - fever or chills, cough, sore throat, pain or difficulty passing urine  signs of decreased platelets or bleeding - bruising, pinpoint red spots on the skin, black, tarry stools, blood in the urine  signs of decreased red blood cells - unusually weak or tired, fainting spells, lightheadedness  breathing problems  changes in vision  chest pain  mouth sores  nausea and vomiting  pain, swelling, redness at site where injected  pain, tingling, numbness in the hands or feet  redness, swelling, or sores on hands or feet  stomach pain  unusual bleeding Side effects that usually do not require medical attention (report to your doctor or health care professional if they continue or are bothersome):  changes  in finger or toe nails  diarrhea  dry or itchy skin  hair loss  headache  loss of appetite  sensitivity of eyes to the light  stomach upset  unusually teary eyes This list may not describe all possible side effects. Call your doctor for medical advice about side effects. You may report side effects to FDA at 1-800-FDA-1088. Where should I keep my medicine? This drug is given in a hospital or clinic and will not be stored at home. NOTE: This sheet is a  summary. It may not cover all possible information. If you have questions about this medicine, talk to your doctor, pharmacist, or health care provider.  2020 Elsevier/Gold Standard (2007-12-23 13:53:16)

## 2020-08-15 NOTE — Progress Notes (Unsigned)
Okay to treat per MD with Crt 2.64

## 2020-08-15 NOTE — Progress Notes (Signed)
[facility] OFFICE PROGRESS NOTE   Diagnosis: Colon cancer  INTERVAL HISTORY:   Bradley [name] returns as scheduled. He underwent Port-A-Cath placement on 08/09/2020. He has attended a chemotherapy teaching class. He had constipation yesterday. No other complaint.  Objective:  Vital signs in last 24 hours:  Blood pressure (!) 135/49, pulse (!) 57, temperature 99.1 F (37.3 C), temperature source Tympanic, resp. rate 16, height _0  (1.753 m), weight 165 lb 6.4 oz (75 kg), SpO2 100 %.    Resp: Lungs clear bilaterally Cardio: Regular rate and rhythm GI: No hepatosplenomegaly, nontender, no mass Vascular: No leg edema   Portacath/PICC-without erythema  Lab Results:  Lab Results  Component Value Date   WBC 9.4 08/15/2020   HGB 9.7 (L) 08/15/2020   HCT 31.1 (L) 08/15/2020   MCV 88.4 08/15/2020   PLT 194 08/15/2020   NEUTROABS 7.9 (H) 08/15/2020    CMP  Lab Results  Component Value Date   NA 139 08/15/2020   K 5.1 08/15/2020   CL 109 08/15/2020   CO2 23 08/15/2020   GLUCOSE 183 (H) 08/15/2020   BUN 35 (H) 08/15/2020   CREATININE 2.64 (H) 08/15/2020   CALCIUM 8.5 (L) 08/15/2020   PROT 6.2 (L) 08/15/2020   ALBUMIN 3.1 (L) 08/15/2020   AST 20 08/15/2020   ALT 22 08/15/2020   ALKPHOS 76 08/15/2020   BILITOT 0.6 08/15/2020   GFRNONAA 23 (L) 08/15/2020   GFRAA 26 (L) 05/26/2020    Lab Results  Component Value Date   CEA1 18.54 (H) 06/19/2020    Medications: I have reviewed the patient's current medications.   Assessment/Plan: 1. Adenocarcinoma of the right colon(hepatic flexure), stage IIb (T4a, N0), status post a right colectomy 10/08/2018  Colonoscopy 08/04/2018-mass at the cecum, multiple polyps, biopsy of the cecal mass confirmed invasive adenocarcinoma, the polyps from the transverse colon and sigmoid colon returned as tubular adenomas  0/19 lymph nodes, no lymphovascular or perineural invasion, tumor disrupted with tumor less than 0.1 cm  from the serosal surface, inked margin involved by tumor (discussed with Dr. Dema Severin. He reports the tumor was located at the hepatic flexure. There was no gross involvement of adjacent structures. The tumor fractured upon removal from the abdomen. The positive inked margin is related to the tumor fracture. He does not recommend adjuvant radiation).  MSS, no loss of mismatch repair protein expression  Normal preoperative CEA  CT abdomen/pelvis 08/14/2018 with irregular wall thickening at the hepatic flexure and irregular wall thickening at the cecum, no evidence of metastatic disease  CT chest 09/03/2018-no evidence of metastatic disease  Patient declined adjuvant chemotherapy  CTs 11/19/2019-negative for recurrent disease  CEA elevated 05/26/2020  CTs 06/26/2020-multiple new and enlarging soft tissue nodules about the anastomotic site in the upper abdomen, anterior to the stomach and liver as well as within the mesocolon.  Cycle 1 FOLFOX 08/15/2020  2.Chronic renal failure 3.Anemia secondary to #1 and #2-ferritin 17 on 05/26/2020; transfused 2 units of blood 06/23/2020; stool positive for occult blood 06/28/2020  IV iron 06/28/2020 and 07/05/2020 4.Coronary artery disease 5.Multiple polyps-tubular adenomas noted on the 08/04/2018 colonoscopy and the 10/08/2018 right colectomy specimen  Colonoscopy 10/05/2019-tubular adenoma removed from the transverse colon    Disposition: Bradley [name] appears unchanged.  He will complete cycle 1 FOLFOX today.  He has attended a chemotherapy teaching class.  He agrees to proceed.  He appears asymptomatic from anemia at present.  Bradley [name] will return for an office visit and chemotherapy  in 2 weeks.  Betsy Coder, MD  08/15/2020  10:21 AM

## 2020-08-16 ENCOUNTER — Telehealth: Payer: Self-pay | Admitting: Oncology

## 2020-08-16 NOTE — Telephone Encounter (Signed)
Scheduled appointments per 12/14 los. Called patient, no answer. Left message with appointments dates and times.

## 2020-08-17 ENCOUNTER — Inpatient Hospital Stay: Payer: Medicare Other

## 2020-08-17 ENCOUNTER — Telehealth: Payer: Self-pay | Admitting: *Deleted

## 2020-08-17 ENCOUNTER — Other Ambulatory Visit: Payer: Self-pay

## 2020-08-17 NOTE — Telephone Encounter (Signed)
MD had told him to stop the Preservision Areds with MVI. She has found a preparation with only Vitamin C, E, and zinc. Can he take this 1/day. Per Dr. Benay Spice: OK to take daily as long as there is NO folic acid. Left this message on her home voice mail.

## 2020-08-24 ENCOUNTER — Telehealth: Payer: Self-pay | Admitting: *Deleted

## 2020-08-24 MED ORDER — LIDOCAINE-PRILOCAINE 2.5-2.5 % EX CREA: 1.0000 "application " | TOPICAL_CREAM | CUTANEOUS | 1 refills | Status: AC

## 2020-08-24 NOTE — Telephone Encounter (Signed)
Reports that [facility] does not have the EMLA cream in stock. Asking to send it to [facility] instead or [facility] on [address] in [place]. Re-sent to [facility]

## 2020-08-28 ENCOUNTER — Other Ambulatory Visit: Payer: Self-pay | Admitting: Oncology

## 2020-08-28 MED FILL — Dexamethasone Sodium Phosphate Inj 100 MG/10ML: INTRAMUSCULAR | Qty: 1 | Status: AC

## 2020-08-29 ENCOUNTER — Inpatient Hospital Stay: Payer: Medicare Other

## 2020-08-29 ENCOUNTER — Other Ambulatory Visit: Payer: Self-pay

## 2020-08-29 ENCOUNTER — Inpatient Hospital Stay (HOSPITAL_BASED_OUTPATIENT_CLINIC_OR_DEPARTMENT_OTHER): Payer: Medicare Other | Admitting: Nurse Practitioner

## 2020-08-29 ENCOUNTER — Encounter: Payer: Self-pay | Admitting: Nurse Practitioner

## 2020-08-29 VITALS — BP 147/55 | HR 65 | Temp 99.1°F | Resp 17 | Ht 69.0 in | Wt 160.4 lb

## 2020-08-29 DIAGNOSIS — C182 Malignant neoplasm of ascending colon: Secondary | ICD-10-CM

## 2020-08-29 DIAGNOSIS — Z5111 Encounter for antineoplastic chemotherapy: Secondary | ICD-10-CM | POA: Diagnosis not present

## 2020-08-29 LAB — CBC WITH DIFFERENTIAL (CANCER CENTER ONLY)
Abs Immature Granulocytes: 0.01 10*3/uL (ref 0.00–0.07)
Basophils Absolute: 0 10*3/uL (ref 0.0–0.1)
Basophils Relative: 0 %
Eosinophils Absolute: 0.1 10*3/uL (ref 0.0–0.5)
Eosinophils Relative: 3 %
HCT: 31 % — ABNORMAL LOW (ref 39.0–52.0)
Hemoglobin: 9.9 g/dL — ABNORMAL LOW (ref 13.0–17.0)
Immature Granulocytes: 0 %
Lymphocytes Relative: 29 %
Lymphs Abs: 0.8 10*3/uL (ref 0.7–4.0)
MCH: 27.9 pg (ref 26.0–34.0)
MCHC: 31.9 g/dL (ref 30.0–36.0)
MCV: 87.3 fL (ref 80.0–100.0)
Monocytes Absolute: 0.8 10*3/uL (ref 0.1–1.0)
Monocytes Relative: 26 %
Neutro Abs: 1.2 10*3/uL — ABNORMAL LOW (ref 1.7–7.7)
Neutrophils Relative %: 42 %
Platelet Count: 144 10*3/uL — ABNORMAL LOW (ref 150–400)
RBC: 3.55 MIL/uL — ABNORMAL LOW (ref 4.22–5.81)
RDW: 20.2 % — ABNORMAL HIGH (ref 11.5–15.5)
WBC Count: 2.9 10*3/uL — ABNORMAL LOW (ref 4.0–10.5)
nRBC: 0 % (ref 0.0–0.2)

## 2020-08-29 LAB — CMP (CANCER CENTER ONLY)
ALT: 10 U/L (ref 0–44)
AST: 16 U/L (ref 15–41)
Albumin: 3 g/dL — ABNORMAL LOW (ref 3.5–5.0)
Alkaline Phosphatase: 59 U/L (ref 38–126)
Anion gap: 7 (ref 5–15)
BUN: 38 mg/dL — ABNORMAL HIGH (ref 8–23)
CO2: 26 mmol/L (ref 22–32)
Calcium: 8.5 mg/dL — ABNORMAL LOW (ref 8.9–10.3)
Chloride: 106 mmol/L (ref 98–111)
Creatinine: 2.62 mg/dL — ABNORMAL HIGH (ref 0.61–1.24)
GFR, Estimated: 23 mL/min — ABNORMAL LOW (ref >60)
Glucose, Bld: 138 mg/dL — ABNORMAL HIGH (ref 70–99)
Potassium: 4.6 mmol/L (ref 3.5–5.1)
Sodium: 139 mmol/L (ref 135–145)
Total Bilirubin: 0.6 mg/dL (ref 0.3–1.2)
Total Protein: 6.3 g/dL — ABNORMAL LOW (ref 6.5–8.1)

## 2020-08-29 MED ORDER — SODIUM CHLORIDE 0.9% FLUSH
10.0000 mL | Freq: Once | INTRAVENOUS | Status: AC | PRN
  Administered 2020-08-29: 11:00:00 10 mL
  Filled 2020-08-29: qty 10

## 2020-08-29 NOTE — Progress Notes (Signed)
  [place] OFFICE PROGRESS NOTE   Diagnosis: Colon cancer  INTERVAL HISTORY:   Bradley [name] returns as scheduled.  He completed cycle 1 FOLFOX 08/15/2020.  Denies nausea/vomiting.  No mouth sores.  No diarrhea.  His wife reports he developed a stool "impaction and "over the weekend.  She gave him multiple doses of milk of magnesia.  Bowels now moving.  She further reports very limited oral intake over the past 3 days.  He is spending the majority of the day in bed.  Objective:  Vital signs in last 24 hours:  Blood pressure (!) 147/55, pulse 65, temperature 99.1 F (37.3 C), resp. rate 17, height 5' 9" (1.753 m), weight 160 lb 6.4 oz (72.8 kg), SpO2 98 %.    HEENT: No thrush or ulcers. Resp: Lungs clear bilaterally. Cardio: Regular rate and rhythm. GI: Abdomen soft and nontender.  No hepatomegaly. Vascular: No leg edema. Port-A-Cath without erythema.  Lab Results:  Lab Results  Component Value Date   WBC 2.9 (L) 08/29/2020   HGB 9.9 (L) 08/29/2020   HCT 31.0 (L) 08/29/2020   MCV 87.3 08/29/2020   PLT 144 (L) 08/29/2020   NEUTROABS 1.2 (L) 08/29/2020    Imaging:  No results found.  Medications: I have reviewed the patient's current medications.  Assessment/Plan: 1. Adenocarcinoma of the right colon(hepatic flexure), stage IIb (T4a, N0), status post a right colectomy 10/08/2018  Colonoscopy 08/04/2018-mass at the cecum, multiple polyps, biopsy of the cecal mass confirmed invasive adenocarcinoma, the polyps from the transverse colon and sigmoid colon returned as tubular adenomas  0/19 lymph nodes, no lymphovascular or perineural invasion, tumor disrupted with tumor less than 0.1 cm from the serosal surface, inked margin involved by tumor (discussed with Dr. Dema Severin. He reports the tumor was located at the hepatic flexure. There was no gross involvement of adjacent structures. The tumor fractured upon removal from the abdomen. The positive inked margin is  related to the tumor fracture. He does not recommend adjuvant radiation).  MSS, no loss of mismatch repair protein expression  Normal preoperative CEA  CT abdomen/pelvis 08/14/2018 with irregular wall thickening at the hepatic flexure and irregular wall thickening at the cecum, no evidence of metastatic disease  CT chest 09/03/2018-no evidence of metastatic disease  Patient declined adjuvant chemotherapy  CTs 11/19/2019-negative for recurrent disease  CEA elevated 05/26/2020  CTs 06/26/2020-multiple new and enlarging soft tissue nodules about the anastomotic site in the upper abdomen, anterior to the stomach and liver as well as within the mesocolon.  Cycle 1 FOLFOX 08/15/2020  2.Chronic renal failure 3.Anemia secondary to #1 and #2-ferritin 17 on 05/26/2020; transfused 2 units of blood 06/23/2020; stool positive for occult blood 06/28/2020  IV iron 06/28/2020 and 07/05/2020 4.Coronary artery disease 5.Multiple polyps-tubular adenomas noted on the 08/04/2018 colonoscopy and the 10/08/2018 right colectomy specimen  Colonoscopy 10/05/2019-tubular adenoma removed from the transverse colon   Disposition: Bradley [name] completed cycle 1 FOLFOX 08/15/2020.  Over the past several days his performance status has declined.  CBC from today shows mild neutropenia.  We decided to hold today's treatment, reevaluate in 1 week.  He and his wife agree with this plan.  They will work on nutrition/hydration and activity.  Neutropenic precautions reviewed.  They understand to contact the office with fever, chills, other signs of infection.  Plan reviewed with Dr. Benay Spice.    Bradley [name] ANP/GNP-BC   08/29/2020  11:57 AM

## 2020-08-30 ENCOUNTER — Other Ambulatory Visit: Payer: Self-pay | Admitting: *Deleted

## 2020-08-30 ENCOUNTER — Other Ambulatory Visit: Payer: Self-pay

## 2020-08-30 ENCOUNTER — Telehealth: Payer: Self-pay | Admitting: *Deleted

## 2020-08-30 ENCOUNTER — Inpatient Hospital Stay: Payer: Medicare Other

## 2020-08-30 ENCOUNTER — Telehealth: Payer: Self-pay | Admitting: Nurse Practitioner

## 2020-08-30 DIAGNOSIS — C182 Malignant neoplasm of ascending colon: Secondary | ICD-10-CM

## 2020-08-30 DIAGNOSIS — Z5111 Encounter for antineoplastic chemotherapy: Secondary | ICD-10-CM | POA: Diagnosis not present

## 2020-08-30 MED ORDER — HEPARIN SOD (PORK) LOCK FLUSH 100 UNIT/ML IV SOLN
500.0000 [IU] | Freq: Once | INTRAVENOUS | Status: AC
  Administered 2020-08-30: 15:00:00 500 [IU] via INTRAVENOUS
  Filled 2020-08-30: qty 5

## 2020-08-30 MED ORDER — ONDANSETRON HCL 4 MG/2ML IJ SOLN
INTRAMUSCULAR | Status: AC
  Filled 2020-08-30: qty 4

## 2020-08-30 MED ORDER — ONDANSETRON HCL 4 MG/2ML IJ SOLN
8.0000 mg | Freq: Once | INTRAMUSCULAR | Status: AC
  Administered 2020-08-30: 8 mg via INTRAVENOUS

## 2020-08-30 MED ORDER — SODIUM CHLORIDE 0.9 % IV SOLN
INTRAVENOUS | Status: AC
  Filled 2020-08-30 (×2): qty 250

## 2020-08-30 MED ORDER — SODIUM CHLORIDE 0.9% FLUSH
10.0000 mL | Freq: Once | INTRAVENOUS | Status: AC
  Administered 2020-08-30: 15:00:00 10 mL via INTRAVENOUS
  Filled 2020-08-30: qty 10

## 2020-08-30 NOTE — Telephone Encounter (Signed)
Scheduled appointments per 12/28 los. Okay per Ned Card to schedule tx on 1/8 instead of 1/4. Spoke to patient's wife who said she would rather just have an updated calendar printed for patient when he comes in today for visit. Per her request we will have an updated calendar printed.

## 2020-08-30 NOTE — Telephone Encounter (Signed)
Wife called to report he is no better than he was yesterday and has vomited last night and this morning. Asking to come in for IV fluids. Scheduled for 1 liter NS w/Zofran 8 mg IV today at 1 pm. Wife is aware.

## 2020-08-30 NOTE — Patient Instructions (Signed)

## 2020-08-30 NOTE — Progress Notes (Signed)
IV fluid and anti-emetic orders placed for today at 1 pm

## 2020-09-05 ENCOUNTER — Other Ambulatory Visit: Payer: Medicare Other

## 2020-09-05 ENCOUNTER — Encounter (HOSPITAL_COMMUNITY): Payer: Self-pay | Admitting: Emergency Medicine

## 2020-09-05 ENCOUNTER — Other Ambulatory Visit: Payer: Self-pay | Admitting: *Deleted

## 2020-09-05 ENCOUNTER — Emergency Department (HOSPITAL_COMMUNITY)
Admission: EM | Admit: 2020-09-05 | Discharge: 2020-09-05 | Disposition: A | Discharge status: Home / Self Care | Payer: Medicare Other | Attending: Emergency Medicine | Admitting: Emergency Medicine

## 2020-09-05 ENCOUNTER — Ambulatory Visit: Payer: Medicare Other | Admitting: Nurse Practitioner

## 2020-09-05 DIAGNOSIS — C182 Malignant neoplasm of ascending colon: Secondary | ICD-10-CM

## 2020-09-05 DIAGNOSIS — Z5321 Procedure and treatment not carried out due to patient leaving prior to being seen by health care provider: Secondary | ICD-10-CM | POA: Diagnosis not present

## 2020-09-05 DIAGNOSIS — R627 Adult failure to thrive: Secondary | ICD-10-CM | POA: Insufficient documentation

## 2020-09-05 NOTE — ED Triage Notes (Signed)
Per EMS-states history of cancer-poor PO intake for over a week

## 2020-09-05 NOTE — Progress Notes (Signed)
Wife reports patient was in ER for several hours without being seen. Decided to take him home when told it would be 20 hour wait to see MD. Asking if he can get IV fluids tomorrow. She had EMS take him to ER due to not eating or drinking and extreme weakness.

## 2020-09-06 ENCOUNTER — Other Ambulatory Visit: Payer: Self-pay | Admitting: Medical

## 2020-09-06 ENCOUNTER — Observation Stay (HOSPITAL_COMMUNITY): Payer: Medicare Other

## 2020-09-06 ENCOUNTER — Inpatient Hospital Stay: Payer: Medicare Other | Admitting: Medical

## 2020-09-06 ENCOUNTER — Inpatient Hospital Stay: Payer: Medicare Other | Attending: Oncology

## 2020-09-06 ENCOUNTER — Encounter (HOSPITAL_COMMUNITY): Payer: Self-pay | Admitting: Internal Medicine

## 2020-09-06 ENCOUNTER — Observation Stay (HOSPITAL_COMMUNITY)
Admission: AD | Admit: 2020-09-06 | Discharge: 2020-09-07 | Disposition: A | Discharge status: Home / Self Care | Payer: Medicare Other | Source: Ambulatory Visit | Attending: Family Medicine | Admitting: Family Medicine

## 2020-09-06 ENCOUNTER — Other Ambulatory Visit: Payer: Self-pay

## 2020-09-06 ENCOUNTER — Inpatient Hospital Stay (HOSPITAL_BASED_OUTPATIENT_CLINIC_OR_DEPARTMENT_OTHER): Payer: Medicare Other | Admitting: Nurse Practitioner

## 2020-09-06 ENCOUNTER — Encounter: Payer: Self-pay | Admitting: Nurse Practitioner

## 2020-09-06 ENCOUNTER — Inpatient Hospital Stay: Payer: Medicare Other

## 2020-09-06 VITALS — BP 157/51 | HR 74 | Temp 97.0°F | Resp 16 | Ht 69.0 in | Wt 154.4 lb

## 2020-09-06 DIAGNOSIS — E872 Acidosis: Secondary | ICD-10-CM | POA: Insufficient documentation

## 2020-09-06 DIAGNOSIS — R1084 Generalized abdominal pain: Secondary | ICD-10-CM

## 2020-09-06 DIAGNOSIS — Z79899 Other long term (current) drug therapy: Secondary | ICD-10-CM | POA: Insufficient documentation

## 2020-09-06 DIAGNOSIS — Z7982 Long term (current) use of aspirin: Secondary | ICD-10-CM | POA: Insufficient documentation

## 2020-09-06 DIAGNOSIS — R059 Cough, unspecified: Secondary | ICD-10-CM

## 2020-09-06 DIAGNOSIS — C182 Malignant neoplasm of ascending colon: Secondary | ICD-10-CM

## 2020-09-06 DIAGNOSIS — K56609 Unspecified intestinal obstruction, unspecified as to partial versus complete obstruction: Secondary | ICD-10-CM

## 2020-09-06 DIAGNOSIS — R197 Diarrhea, unspecified: Secondary | ICD-10-CM | POA: Diagnosis not present

## 2020-09-06 DIAGNOSIS — I2510 Atherosclerotic heart disease of native coronary artery without angina pectoris: Secondary | ICD-10-CM | POA: Insufficient documentation

## 2020-09-06 DIAGNOSIS — E860 Dehydration: Secondary | ICD-10-CM | POA: Insufficient documentation

## 2020-09-06 DIAGNOSIS — Z85038 Personal history of other malignant neoplasm of large intestine: Secondary | ICD-10-CM | POA: Diagnosis not present

## 2020-09-06 DIAGNOSIS — I129 Hypertensive chronic kidney disease with stage 1 through stage 4 chronic kidney disease, or unspecified chronic kidney disease: Secondary | ICD-10-CM | POA: Insufficient documentation

## 2020-09-06 DIAGNOSIS — N189 Chronic kidney disease, unspecified: Secondary | ICD-10-CM | POA: Diagnosis not present

## 2020-09-06 DIAGNOSIS — Z1152 Encounter for screening for COVID-19: Secondary | ICD-10-CM | POA: Insufficient documentation

## 2020-09-06 DIAGNOSIS — R109 Unspecified abdominal pain: Secondary | ICD-10-CM | POA: Diagnosis present

## 2020-09-06 DIAGNOSIS — D649 Anemia, unspecified: Secondary | ICD-10-CM | POA: Diagnosis not present

## 2020-09-06 DIAGNOSIS — R112 Nausea with vomiting, unspecified: Secondary | ICD-10-CM | POA: Insufficient documentation

## 2020-09-06 DIAGNOSIS — R531 Weakness: Secondary | ICD-10-CM | POA: Diagnosis not present

## 2020-09-06 DIAGNOSIS — N179 Acute kidney failure, unspecified: Secondary | ICD-10-CM

## 2020-09-06 DIAGNOSIS — Z85828 Personal history of other malignant neoplasm of skin: Secondary | ICD-10-CM | POA: Insufficient documentation

## 2020-09-06 LAB — LACTIC ACID, PLASMA
Lactic Acid, Venous: 0.7 mmol/L (ref 0.5–1.9)
Lactic Acid, Venous: 0.8 mmol/L (ref 0.5–1.9)

## 2020-09-06 LAB — CMP (CANCER CENTER ONLY)
ALT: 67 U/L — ABNORMAL HIGH (ref 0–44)
AST: 51 U/L — ABNORMAL HIGH (ref 15–41)
Albumin: 3.1 g/dL — ABNORMAL LOW (ref 3.5–5.0)
Alkaline Phosphatase: 69 U/L (ref 38–126)
Anion gap: 19 — ABNORMAL HIGH (ref 5–15)
BUN: 87 mg/dL — ABNORMAL HIGH (ref 8–23)
CO2: 16 mmol/L — ABNORMAL LOW (ref 22–32)
Calcium: 8.2 mg/dL — ABNORMAL LOW (ref 8.9–10.3)
Chloride: 100 mmol/L (ref 98–111)
Creatinine: 3.96 mg/dL (ref 0.61–1.24)
GFR, Estimated: 14 mL/min — ABNORMAL LOW (ref >60)
Glucose, Bld: 131 mg/dL — ABNORMAL HIGH (ref 70–99)
Potassium: 3.7 mmol/L (ref 3.5–5.1)
Sodium: 135 mmol/L (ref 135–145)
Total Bilirubin: 0.6 mg/dL (ref 0.3–1.2)
Total Protein: 6.5 g/dL (ref 6.5–8.1)

## 2020-09-06 LAB — CBC WITH DIFFERENTIAL (CANCER CENTER ONLY)
Abs Immature Granulocytes: 0.17 10*3/uL — ABNORMAL HIGH (ref 0.00–0.07)
Basophils Absolute: 0 10*3/uL (ref 0.0–0.1)
Basophils Relative: 0 %
Eosinophils Absolute: 0 10*3/uL (ref 0.0–0.5)
Eosinophils Relative: 0 %
HCT: 35.9 % — ABNORMAL LOW (ref 39.0–52.0)
Hemoglobin: 11.7 g/dL — ABNORMAL LOW (ref 13.0–17.0)
Immature Granulocytes: 1 %
Lymphocytes Relative: 13 %
Lymphs Abs: 2.3 10*3/uL (ref 0.7–4.0)
MCH: 27.7 pg (ref 26.0–34.0)
MCHC: 32.6 g/dL (ref 30.0–36.0)
MCV: 85.1 fL (ref 80.0–100.0)
Monocytes Absolute: 1 10*3/uL (ref 0.1–1.0)
Monocytes Relative: 6 %
Neutro Abs: 14.8 10*3/uL — ABNORMAL HIGH (ref 1.7–7.7)
Neutrophils Relative %: 80 %
Platelet Count: 400 10*3/uL (ref 150–400)
RBC: 4.22 MIL/uL (ref 4.22–5.81)
RDW: 19.1 % — ABNORMAL HIGH (ref 11.5–15.5)
WBC Count: 18.3 10*3/uL — ABNORMAL HIGH (ref 4.0–10.5)
nRBC: 0 % (ref 0.0–0.2)

## 2020-09-06 LAB — URINALYSIS, ROUTINE W REFLEX MICROSCOPIC
Bilirubin Urine: NEGATIVE
Glucose, UA: NEGATIVE mg/dL
Hgb urine dipstick: NEGATIVE
Ketones, ur: 5 mg/dL — AB
Leukocytes,Ua: NEGATIVE
Nitrite: NEGATIVE
Protein, ur: NEGATIVE mg/dL
Specific Gravity, Urine: 1.014 (ref 1.005–1.030)
pH: 5 (ref 5.0–8.0)

## 2020-09-06 LAB — PHOSPHORUS: Phosphorus: 7.2 mg/dL — ABNORMAL HIGH (ref 2.5–4.6)

## 2020-09-06 LAB — MAGNESIUM: Magnesium: 2.5 mg/dL — ABNORMAL HIGH (ref 1.7–2.4)

## 2020-09-06 MED ORDER — SODIUM CHLORIDE 0.9 % IV SOLN: INTRAVENOUS | Status: DC

## 2020-09-06 MED ORDER — ACETAMINOPHEN 650 MG RE SUPP: 650.0000 mg | Freq: Four times a day (QID) | RECTAL | Status: DC | PRN

## 2020-09-06 MED ORDER — ASPIRIN EC 81 MG PO TBEC: 81.0000 mg | DELAYED_RELEASE_TABLET | Freq: Every day | ORAL | Status: DC

## 2020-09-06 MED ORDER — ACETAMINOPHEN 325 MG PO TABS: 650.0000 mg | ORAL_TABLET | Freq: Four times a day (QID) | ORAL | Status: DC | PRN

## 2020-09-06 MED ORDER — SODIUM CHLORIDE 0.9% FLUSH
10.0000 mL | INTRAVENOUS | Status: DC | PRN
  Administered 2020-09-06: 10 mL via INTRAVENOUS
  Filled 2020-09-06: qty 10

## 2020-09-06 MED ORDER — SODIUM CHLORIDE 0.9% FLUSH
10.0000 mL | INTRAVENOUS | Status: DC | PRN
  Administered 2020-09-06: 10 mL
  Filled 2020-09-06: qty 10

## 2020-09-06 MED ORDER — ONDANSETRON HCL 4 MG/2ML IJ SOLN: 4.0000 mg | Freq: Four times a day (QID) | INTRAMUSCULAR | Status: DC | PRN

## 2020-09-06 MED ORDER — ATENOLOL 25 MG PO TABS: 25.0000 mg | ORAL_TABLET | Freq: Every day | ORAL | Status: DC

## 2020-09-06 MED ORDER — SODIUM CHLORIDE 0.9 % IV SOLN
INTRAVENOUS | Status: DC
  Filled 2020-09-06 (×2): qty 250

## 2020-09-06 MED ORDER — ONDANSETRON HCL 4 MG PO TABS: 4.0000 mg | ORAL_TABLET | Freq: Four times a day (QID) | ORAL | Status: DC | PRN

## 2020-09-06 MED ORDER — ENSURE ENLIVE PO LIQD: 237.0000 mL | Freq: Two times a day (BID) | ORAL | Status: DC

## 2020-09-06 NOTE — Progress Notes (Signed)
Received call from radiology stating that abd xray results are consistent with a SBO. Notified on call NP, received orders for NG to LIWS and keep NPO. Hortencia Conradi RN

## 2020-09-06 NOTE — Patient Instructions (Signed)

## 2020-09-06 NOTE — Addendum Note (Signed)
Addended by: Tania Ade on: 09/06/2020 03:30 PM   Modules accepted: Orders, SmartSet

## 2020-09-06 NOTE — H&P (Signed)
History and Physical    Bradley [name]:[id number] DOB: [birth year] DOA: 09/06/2020  PCP: Bradley Berlin, DO  [facility]  Chief Complaint: abdominal distention and pain.   HPI: Bradley [name] is a 87 y.o. male with medical history significant of colon cancer. History is from wife as patient is very hard of hearing. She reports that he has cycled back and forth between diarrhea and constipation over the last couple of weeks. When he has constipation, his stomach get distended and he is unable to pass stool or gas. This will last for a day then switch to diarrhea. He has had N/V and poor PO intake over these past couple of weeks. She reported his symptoms to his PCP. It was recommended that he try senokot and milk of magnesia. When tried, the constipation would resolve, but the diarrhea would begin again. He was evaluated by his Oncologist today; who found him significantly dehydrated and recommended that he come to the hospital for assistance.    Of note, he started chemo for colon CA on 08/15/20. He has had one cycle.   Review of Systems:  Denies CP, palpitations, dyspnea, fevers. Review of systems is otherwise negative for all not mentioned in HPI.   PMHx Past Medical History:  Diagnosis Date  . Anemia    HEME OCCULT POSITIVE   . Blood transfusion without reported diagnosis   . CAD (coronary artery disease)    THEY FOUND A BLOCKAGE IN 1999 DURNG A CATHERIZATION , NOT ENOUGH TO  NEED A STENT , THEY JUST CLEANED ME OUT   . Cancer [facility])    SKIN CANCER   . Chronic kidney disease   . Colon cancer ([place])   . Colon polyps 03/2004  . Diverticulosis   . 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 Dyspnea    ON EXERTION   . Elevated cholesterol   . Family history of breast cancer   . Family history of esophageal cancer   . Family history of pancreatic cancer   . High blood pressure   . History of colon polyps   . History of migraine    childhood     PSHx Past Surgical History:  Procedure Laterality Date  . ANGIOPLASTY  1999  . APPENDECTOMY    . CATARACT EXTRACTION, BILATERAL    . CATHERIZATION   1999  . CHOLECYSTECTOMY    . COLON SURGERY    . COLONOSCOPY  07/21/2007   adenomatous polyps diverticulosis  . ESOPHAGOGASTRODUODENOSCOPY  02/07/2012   mild gastritis   . LAPAROSCOPIC RIGHT HEMI COLECTOMY Right 10/08/2018   Procedure: LAPAROSCOPIC RIGHT HEMI COLECTOMY ERAS PATHWAY;  Surgeon: Ileana Roup, MD;  Location: [facility];  Service: General;  Laterality: Right;  . NECK SURGERY     Fatty lipoma  . PORTACATH PLACEMENT Right 08/09/2020   Procedure: ULTRASOUND GUIDED ACCES OF RIGHT IJ AND INSERTION PORT-A-CATH WITH FLUEROSCOPY;  Surgeon: Ileana Roup, MD;  Location: [facility];  Service: General;  Laterality: Right;  . SIGMOIDOSCOPY  07/06/2009   diverticulosis  . TONSILLECTOMY     as a child  . UPPER GASTROINTESTINAL ENDOSCOPY      SocHx  reports that he has never smoked. He has never used smokeless tobacco. He reports current alcohol use. He reports that he does not use drugs.  No Known Allergies  FamHx Family History  Problem Relation Age of Onset  . Esophageal cancer Father   . High blood pressure Mother   .  Kidney failure Mother   . High blood pressure Sister   . Breast cancer Sister        dx < 80   . Pancreatic cancer Cousin   . Cancer Cousin        unk type  . Cancer Cousin        blood cancer  . Colon cancer Neg Hx   . Rectal cancer Neg Hx   . Stomach cancer Neg Hx     Prior to Admission medications   Medication Sig Start Date End Date Taking? Authorizing Provider  aspirin EC 81 MG tablet Take 81 mg by mouth at bedtime.     [provider]  atenolol (TENORMIN) 25 MG tablet Take 25 mg by mouth at bedtime.  06/28/18   [provider]  atorvastatin (LIPITOR) 40 MG tablet Take 40 mg by mouth at bedtime.     [provider]  lidocaine-prilocaine (EMLA) cream Apply 1  application topically as directed. 08/24/20   Ladell Pier, MD  Lutein 40 MG CAPS Take 40 mg by mouth at bedtime.     [provider]  Multiple Vitamins-Minerals (PRESERVISION AREDS PO) Take 1 tablet by mouth daily. 08/17/20   [provider]  ondansetron (ZOFRAN) 8 MG tablet Take 1 tablet (8 mg total) by mouth every 8 (eight) hours as needed for nausea or vomiting. Start 72 hours after IV chemotherapy infusion 08/15/20   Ladell Pier, MD  prochlorperazine (COMPAZINE) 5 MG tablet Take 1-2 tablets (5-10 mg total) by mouth every 6 (six) hours as needed for nausea or vomiting. 08/15/20   Ladell Pier, MD    Physical Exam: Vitals:   09/06/20 1607  BP: 131/61  Pulse: 63  Resp: 16  Temp: 97.6 F (36.4 C)  TempSrc: Oral  SpO2: 98%    General: 87 y.o. male resting in bed in NAD Eyes: PERRL, normal sclera ENMT: Nares patent w/o discharge, orophaynx clear, dentition normal, ears w/o discharge/lesions/ulcers Neck: Supple, trachea midline Cardiovascular: RRR, +S1, S2, no m/g/r, equal pulses throughout Respiratory: CTABL, no w/r/r, normal WOB GI: BS+, mild distention, NT, no masses noted, no organomegaly noted MSK: No e/c/c Skin: No rashes, bruises, ulcerations noted Neuro: A&O x 3, no focal deficits, hard of hearing Psyc: Appropriate interaction and affect, calm/cooperative  Labs on Admission: I have personally reviewed following labs and imaging studies  CBC: Recent Labs  Lab 09/06/20 1205  WBC 18.3*  NEUTROABS 14.8*  HGB 11.7*  HCT 35.9*  MCV 85.1  PLT 258   Basic Metabolic Panel: Recent Labs  Lab 09/06/20 1205  NA 135  K 3.7  CL 100  CO2 16*  GLUCOSE 131*  BUN 87*  CREATININE 3.96*  CALCIUM 8.2*   GFR: Estimated Creatinine Clearance: 13 mL/min (A) (by C-G formula based on SCr of 3.96 mg/dL [facility])). Liver Function Tests: Recent Labs  Lab 09/06/20 1205  AST 51*  ALT 67*  ALKPHOS 69  BILITOT 0.6  PROT 6.5  ALBUMIN 3.1*   No results  for input(s): LIPASE, AMYLASE in the last 168 hours. No results for input(s): AMMONIA in the last 168 hours. Coagulation Profile: No results for input(s): INR, PROTIME in the last 168 hours. Cardiac Enzymes: No results for input(s): CKTOTAL, CKMB, CKMBINDEX, TROPONINI in the last 168 hours. BNP (last 3 results) No results for input(s): PROBNP in the last 8760 hours. HbA1C: No results for input(s): HGBA1C in the last 72 hours. CBG: No results for input(s): GLUCAP in  the last 168 hours. Lipid Profile: No results for input(s): CHOL, HDL, LDLCALC, TRIG, CHOLHDL, LDLDIRECT in the last 72 hours. Thyroid Function Tests: No results for input(s): TSH, T4TOTAL, FREET4, T3FREE, THYROIDAB in the last 72 hours. Anemia Panel: No results for input(s): VITAMINB12, FOLATE, FERRITIN, TIBC, IRON, RETICCTPCT in the last 72 hours. Urine analysis: No results found for: COLORURINE, APPEARANCEUR, LABSPEC, PHURINE, GLUCOSEU, HGBUR, BILIRUBINUR, KETONESUR, PROTEINUR, UROBILINOGEN, NITRITE, LEUKOCYTESUR  Radiological Exams on Admission: No results found.  Assessment/Plan Abdominal pain/distention N/V     - admit to obs, med-surg     - check KUB     - fluids, full liquid diet, anti-emetics  Hx of Colon CA     - Onco to follow     - palliative care consult  AKI on CKD4 Dehydration     - fluids, check renal US  Metabolic acidosis Leukocytosis     - check lactic acid     - no fevers, WBC up, but his entire CBC seems more concentrated since his last check; may be a fxn of dehydration     - check UA, bld cx, stool studies     - hold abx for right now   Normocytic anemia     - secondary to chronic disease; follow  DVT prophylaxis: SCDs  Code Status: DNR confirmed with wife at bedside.   Family Communication: With wife at bedside.   Consults called: Oncology  Status is: Observation  The patient remains OBS appropriate and will d/c before 2 midnights.  Dispo: The patient is from: Home               Anticipated d/c is to: Home              Anticipated d/c date is: 1 day              Patient currently is not medically stable to d/c.  [name] DO [facility]  If 7PM-7AM, please contact night-coverage [email or web address]  09/06/2020, 4:38 PM

## 2020-09-06 NOTE — Progress Notes (Addendum)
Bradley [name] OFFICE PROGRESS NOTE   Diagnosis: Colon cancer  INTERVAL HISTORY:   Bradley [name] returns as scheduled.  He completed cycle 1 FOLFOX 08/15/2020.  Cycle 2 was held last week due to mild neutropenia and a poor performance status.  He is now having diarrhea.  His wife estimates 5-6 loose stools over the past 24 hours.  The diarrhea began intermittently about a week ago.  He is having periodic nausea/vomiting.  His abdomen is distended.  His wife notes he is "burping".  He denies pain but complains of abdominal "pressure".  He denies fever, cough, shortness of breath.  He is very weak.  Oral intake is poor.  He went to the emergency department yesterday but left due to the wait time.  Objective:  Vital signs in last 24 hours:  Blood pressure (!) 157/51, pulse 74, temperature (!) 97 F (36.1 C), temperature source Tympanic, resp. rate 16, height '5\' 9"'  (3.846 m), weight 154 lb 6.4 oz (70 kg), SpO2 100 %.    HEENT: Mouth is dry appearing.  Sclera anicteric. Resp: Lungs clear bilaterally. Cardio: Regular rate and rhythm. GI: Abdomen is soft, distended.  No hepatomegaly.  Bowel sounds present. Vascular: No leg edema.  Skin: Decreased skin turgor. Port-A-Cath without erythema.   Lab Results:  Lab Results  Component Value Date   WBC 18.3 (H) 09/06/2020   HGB 11.7 (L) 09/06/2020   HCT 35.9 (L) 09/06/2020   MCV 85.1 09/06/2020   PLT 400 09/06/2020   NEUTROABS 14.8 (H) 09/06/2020    Imaging:  No results found.  Medications: I have reviewed the patient's current medications.  Assessment/Plan: 1. Adenocarcinoma of the right colon(hepatic flexure), stage IIb (T4a, N0), status post a right colectomy 10/08/2018  Colonoscopy 08/04/2018-mass at the cecum, multiple polyps, biopsy of the cecal mass confirmed invasive adenocarcinoma, the polyps from the transverse colon and sigmoid colon returned as tubular adenomas  0/19 lymph nodes, no lymphovascular or perineural  invasion, tumor disrupted with tumor less than 0.1 cm from the serosal surface, inked margin involved by tumor (discussed with Dr. Dema Severin. He reports the tumor was located at the hepatic flexure. There was no gross involvement of adjacent structures. The tumor fractured upon removal from the abdomen. The positive inked margin is related to the tumor fracture. He does not recommend adjuvant radiation).  MSS, no loss of mismatch repair protein expression  Normal preoperative CEA  CT abdomen/pelvis 08/14/2018 with irregular wall thickening at the hepatic flexure and irregular wall thickening at the cecum, no evidence of metastatic disease  CT chest 09/03/2018-no evidence of metastatic disease  Patient declined adjuvant chemotherapy  CTs 11/19/2019-negative for recurrent disease  CEA elevated 05/26/2020  CTs 06/26/2020-multiple new and enlarging soft tissue nodules about the anastomotic site in the upper abdomen, anterior to the stomach and liver as well as within the mesocolon.  Cycle 1 FOLFOX 08/15/2020  Chemotherapy held 08/29/2020 due to mild neutropenia and poor performance status  2.Chronic renal failure 3.Anemia secondary to #1 and #2-ferritin 17 on 05/26/2020; transfused 2 units of blood 06/23/2020; stool positive for occult blood 06/28/2020  IV iron 06/28/2020 and 07/05/2020 4.Coronary artery disease 5.Multiple polyps-tubular adenomas noted on the 08/04/2018 colonoscopy and the 10/08/2018 right colectomy specimen  Colonoscopy 10/05/2019-tubular adenoma removed from the transverse colon   Disposition: Bradley [name] has metastatic colon cancer.  He completed 1 cycle of FOLFOX chemotherapy 08/15/2020.  Cycle 2 was held on 08/29/2020 due to mild neutropenia and poor performance status.  He  presents today for follow-up reporting nausea/vomiting and diarrhea, weakness.  He may have a partial bowel obstruction.  He appears dehydrated.  We reviewed the CBC and chemistry panel.  Baseline  creatinine about 2.6, today 3.96.  We will begin IV fluids in the office and request admission with the hospitalist service. Covid swab obtained.  Recommend abdominal x-ray and testing for C. difficile.  We will collect a stool sample for C. difficile testing if possible while he is waiting at the [facility] for a bed.  Patient seen with Dr. Benay Spice.  Ned Card ANP/GNP-BC   09/06/2020  12:51 PM This was a shared visit with Ned Card.  Bradley [name] was interviewed and examined.  He appears dehydrated.  He is having nausea/vomiting and diarrhea.  He could have an infectious enteritis, but I am suspicious of a bowel obstruction.  I contacted the hospitalist service.  He will be admitted for intravenous hydration and further evaluation.  Julieanne Manson, MD

## 2020-09-07 DIAGNOSIS — K56609 Unspecified intestinal obstruction, unspecified as to partial versus complete obstruction: Secondary | ICD-10-CM | POA: Diagnosis not present

## 2020-09-07 LAB — GASTROINTESTINAL PANEL BY PCR, STOOL (REPLACES STOOL CULTURE)

## 2020-09-07 LAB — CBC
HCT: 33.3 % — ABNORMAL LOW (ref 39.0–52.0)
Hemoglobin: 10.9 g/dL — ABNORMAL LOW (ref 13.0–17.0)
MCH: 28.5 pg (ref 26.0–34.0)
MCHC: 32.7 g/dL (ref 30.0–36.0)
MCV: 87.2 fL (ref 80.0–100.0)
Platelets: 309 10*3/uL (ref 150–400)
RBC: 3.82 MIL/uL — ABNORMAL LOW (ref 4.22–5.81)
RDW: 19 % — ABNORMAL HIGH (ref 11.5–15.5)
WBC: 36.5 10*3/uL — ABNORMAL HIGH (ref 4.0–10.5)
nRBC: 0 % (ref 0.0–0.2)

## 2020-09-07 LAB — COMPREHENSIVE METABOLIC PANEL
ALT: 379 U/L — ABNORMAL HIGH (ref 0–44)
AST: 773 U/L — ABNORMAL HIGH (ref 15–41)
Albumin: 2.8 g/dL — ABNORMAL LOW (ref 3.5–5.0)
Alkaline Phosphatase: 138 U/L — ABNORMAL HIGH (ref 38–126)
Anion gap: 19 — ABNORMAL HIGH (ref 5–15)
BUN: 92 mg/dL — ABNORMAL HIGH (ref 8–23)
CO2: 13 mmol/L — ABNORMAL LOW (ref 22–32)
Calcium: 7.5 mg/dL — ABNORMAL LOW (ref 8.9–10.3)
Chloride: 103 mmol/L (ref 98–111)
Creatinine, Ser: 3.61 mg/dL — ABNORMAL HIGH (ref 0.61–1.24)
GFR, Estimated: 16 mL/min — ABNORMAL LOW (ref >60)
Glucose, Bld: 96 mg/dL (ref 70–99)
Potassium: 3.1 mmol/L — ABNORMAL LOW (ref 3.5–5.1)
Sodium: 135 mmol/L (ref 135–145)
Total Bilirubin: 1.6 mg/dL — ABNORMAL HIGH (ref 0.3–1.2)
Total Protein: 5.6 g/dL — ABNORMAL LOW (ref 6.5–8.1)

## 2020-09-07 LAB — C DIFFICILE (CDIFF) QUICK SCRN (NO PCR REFLEX)
C Diff antigen: NEGATIVE
C Diff interpretation: NOT DETECTED
C Diff toxin: NEGATIVE

## 2020-09-07 MED ORDER — ONDANSETRON 8 MG PO TBDP: 8.0000 mg | ORAL_TABLET | Freq: Three times a day (TID) | ORAL | 0 refills | Status: AC | PRN

## 2020-09-07 MED ORDER — PROCHLORPERAZINE 25 MG RE SUPP: 25.0000 mg | RECTAL | 0 refills | Status: AC | PRN

## 2020-09-07 MED ORDER — MORPHINE SULFATE (CONCENTRATE) 20 MG/ML PO SOLN: 5.0000 mg | ORAL | 0 refills | Status: AC | PRN

## 2020-09-07 MED ORDER — CHLORHEXIDINE GLUCONATE CLOTH 2 % EX PADS
6.0000 | MEDICATED_PAD | Freq: Every day | CUTANEOUS | Status: DC
  Administered 2020-09-07: 6 via TOPICAL

## 2020-09-07 MED ORDER — HEPARIN SOD (PORK) LOCK FLUSH 100 UNIT/ML IV SOLN
500.0000 [IU] | Freq: Once | INTRAVENOUS | Status: AC
  Administered 2020-09-07: 500 [IU] via INTRAVENOUS
  Filled 2020-09-07: qty 5

## 2020-09-07 NOTE — Progress Notes (Addendum)
HEMATOLOGY-ONCOLOGY PROGRESS NOTE  SUBJECTIVE: Bradley [name] is followed by our office for colon cancer.  He was having increased diarrhea and intermittent nausea and vomiting.  Creatinine elevated.  He was admitted due to symptoms.  Abdominal x-ray showed findings consistent with small bowel obstruction.  Today, abdomen is less distended.  He is having a lot of liquid stools.  No nausea or vomiting reported.  Oncology History  Colon cancer ([place])  10/08/2018 Initial Diagnosis   Colon cancer ([place])   10/30/2018 Cancer Staging   Staging form: Colon and Rectum, AJCC 8th Edition - Pathologic: Stage IIB (pT4a, pN0, cM0) - Signed by Ladell Pier, MD on 10/30/2018    Genetic Testing   Negative genetic testing. No pathogenic variants identified on the [facility] CustomNext+RNAinsight panel. The CustomNext-Cancer + RNAinsight panel  includes sequencing and/or deletion duplication testing of the following 91 genes: AIP, ALK, APC*, ATM*, AXIN2, BAP1, BARD1, BLM, BMPR1A, BRCA1*, BRCA2*, BRIP1*, CDC73, CDH1*, CDK4, CDKN1B, CDKN2A, CHEK2*, CTNNA1, DICER1, FANCC, FH, FLCN, GALNT12, KIF1B, LZTR1, MAX, MEN1, MET, MLH1*, MRE11A, MSH2*, MSH3, MSH6*, MUTYH*, NBN, NF1*, NF2, NTHL1, PALB2*, PHOX2B, PMS2*, POT1, PRKAR1A, PTCH1, PTEN*, RAD50, RAD51C*, RAD51D*, RB1, RECQL, RET, SDHA, SDHAF2, SDHB, SDHC, SDHD, SMAD4, SMARCA4, SMARCB1, SMARCE1, STK11, SUFU, TMEM127, TP53*, TSC1, TSC2, VHL and XRCC2 (sequencing and deletion/duplication); CASR, CFTR, CPA1, CTRC, EGFR, EGLN1, FAM175A, HOXB13, KIT, MITF, MLH3, PALLD, PDGFRA, POLD1, POLE, PRSS1, RINT1, RPS20, SPINK1 and TERT (sequencing only); EPCAM and GREM1 (deletion/duplication only).  The report date is 05/17/2019.    Malignant neoplasm of ascending colon ([place])  08/02/2020 Initial Diagnosis   Malignant neoplasm of ascending colon ([place])   08/15/2020 -  Chemotherapy   The patient had palonosetron (ALOXI) injection 0.25 mg, 0.25 mg, Intravenous,  Once, 2 of 4 cycles Administration: 0.25  mg (08/15/2020) leucovorin 764 mg in dextrose 5 % 250 mL infusion, 400 mg/m2 = 764 mg, Intravenous,  Once, 2 of 4 cycles Administration: 764 mg (08/15/2020) oxaliplatin (ELOXATIN) 125 mg in dextrose 5 % 500 mL chemo infusion, 65 mg/m2 = 125 mg (100 % of original dose 65 mg/m2), Intravenous,  Once, 2 of 4 cycles Dose modification: 65 mg/m2 (original dose 65 mg/m2, Cycle 1, Reason: Provider Judgment) Administration: 125 mg (08/15/2020) fluorouracil (ADRUCIL) 3,800 mg in sodium chloride 0.9 % 74 mL chemo infusion, 2,000 mg/m2 = 3,800 mg (100 % of original dose 2,000 mg/m2), Intravenous, 1 Day/Dose, 2 of 4 cycles Dose modification: 2,000 mg/m2 (original dose 2,000 mg/m2, Cycle 1, Reason: Provider Judgment) Administration: 3,800 mg (08/15/2020)  for chemotherapy treatment.     PHYSICAL EXAMINATION:  Vitals:   09/07/20 0011 09/07/20 0433  BP: (!) 150/47 (!) 124/49  Pulse: 76 83  Resp: 14 16  Temp: 97.7 F (36.5 C) 97.7 F (36.5 C)  SpO2: 98% 98%   There were no vitals filed for this visit.  Intake/Output from previous day: 01/05 0701 - 01/06 0700 In: 817.5 [I.V.:817.5] Out: 100 [Urine:100]  GENERAL: Chronically ill-appearing, no distress SKIN: Decreased skin turgor EYES: No scleral icterus OROPHARYNX: Oral mucosa dry LUNGS: clear to auscultation and percussion with normal breathing effort HEART: regular rate & rhythm and no murmurs and no lower extremity edema ABDOMEN: Positive bowel sounds, soft, no hepatomegaly NEURO: alert & oriented x 3 with fluent speech, no focal motor/sensory deficits  Port-A-Cath without erythema  LABORATORY DATA:  I have reviewed the data as listed CMP Latest Ref Rng & Units 09/07/2020 09/06/2020 08/29/2020  Glucose 70 - 99 mg/dL 96 131(H) 138(H)  BUN 8 -  23 mg/dL 92(H) 87(H) 38(H)  Creatinine 0.61 - 1.24 mg/dL 3.61(H) 3.96(HH) 2.62(H)  Sodium 135 - 145 mmol/L 135 135 139  Potassium 3.5 - 5.1 mmol/L 3.1(L) 3.7 4.6  Chloride 98 - 111 mmol/L 103 100 106   CO2 22 - 32 mmol/L 13(L) 16(L) 26  Calcium 8.9 - 10.3 mg/dL 7.5(L) 8.2(L) 8.5(L)  Total Protein 6.5 - 8.1 g/dL 5.6(L) 6.5 6.3(L)  Total Bilirubin 0.3 - 1.2 mg/dL 1.6(H) 0.6 0.6  Alkaline Phos 38 - 126 U/L 138(H) 69 59  AST 15 - 41 U/L 773(H) 51(H) 16  ALT 0 - 44 U/L 379(H) 67(H) 10    Lab Results  Component Value Date   WBC 36.5 (H) 09/07/2020   HGB 10.9 (L) 09/07/2020   HCT 33.3 (L) 09/07/2020   MCV 87.2 09/07/2020   PLT 309 09/07/2020   NEUTROABS 14.8 (H) 09/06/2020    DG Abd 1 View  Result Date: 09/06/2020 CLINICAL DATA:  History of colon cancer with dehydration and a mixture of constipation and diarrhea. EXAM: ABDOMEN - 1 VIEW COMPARISON:  None. FINDINGS: Multiple dilated small bowel loops are seen throughout the abdomen. There is no evidence of free air. Radiopaque surgical clips are seen overlying the right upper quadrant. No radio-opaque calculi or other significant radiographic abnormality are seen. IMPRESSION: Findings consistent with small bowel obstruction. Electronically Signed   By: Virgina Norfolk M.D.   On: 09/06/2020 20:22   US RENAL  Result Date: 09/06/2020 CLINICAL DATA:  Acute renal insufficiency EXAM: RENAL / URINARY TRACT ULTRASOUND COMPLETE COMPARISON:  11/19/2019, 06/26/2020 FINDINGS: Right Kidney: Renal measurements: 7.9 x 4.3 by 3.0 cm = volume: 53.7 mL. The kidneys are atrophic, with increased renal cortical echotexture and loss of corticomedullary differentiation. Simple cyst measuring 2.6 x 1.7 by 2.3 cm within the lateral mid aspect. No hydronephrosis. Left Kidney: Renal measurements: 7.8 x 4.4 by 5.8 cm = volume: 103.8 mL. The kidneys are atrophic, with increased renal cortical echotexture and loss of corticomedullary differentiation. 2.7 x 2.3 by 3.0 cm simple cyst ventral mid aspect. No hydronephrosis. Bladder: Bladder is minimally distended with no gross abnormalities. Other: Enlarged prostate results in mass effect upon the posterior wall the bladder,  grossly stable since prior CT. IMPRESSION: 1. Bilateral renal cortical atrophy with increased cortical echotexture compatible with medical renal disease. 2. Bilateral simple renal cysts. 3. Enlarged prostate. Electronically Signed   By: Randa Ngo M.D.   On: 09/06/2020 19:35   DG CHEST PORT 1 VIEW  Result Date: 08/09/2020 CLINICAL DATA:  Port-A-Cath placement today. EXAM: PORTABLE CHEST 1 VIEW COMPARISON:  Chest CT 06/26/2020.  No comparison radiographs. FINDINGS: 1446 hours. Two views obtained. There is a right IJ Port-A-Cath, tip terminating at the lower SVC level. The heart size and mediastinal contours appear stable. There is aortic atherosclerosis. The lungs are clear. No pleural effusion or pneumothorax. The bones appear unremarkable. Bilateral carotid atherosclerosis and cholecystectomy clips are noted. IMPRESSION: Satisfactorily positioned right IJ Port-A-Cath. No pneumothorax or acute findings. Electronically Signed   By: Richardean Sale M.D.   On: 08/09/2020 15:22   DG C-Arm 1-60 Min-No Report  Result Date: 08/09/2020 Fluoroscopy was utilized by the requesting physician.  No radiographic interpretation.    ASSESSMENT AND PLAN: 1. Adenocarcinoma of the right colon(hepatic flexure), stage IIb (T4a, N0), status post a right colectomy 10/08/2018  Colonoscopy 08/04/2018-mass at the cecum, multiple polyps, biopsy of the cecal mass confirmed invasive adenocarcinoma, the polyps from the transverse colon and sigmoid colon returned  as tubular adenomas  0/19 lymph nodes, no lymphovascular or perineural invasion, tumor disrupted with tumor less than 0.1 cm from the serosal surface, inked margin involved by tumor (discussed with Dr. Dema Severin. He reports the tumor was located at the hepatic flexure. There was no gross involvement of adjacent structures. The tumor fractured upon removal from the abdomen. The positive inked margin is related to the tumor fracture. He does not recommend adjuvant  radiation).  MSS, no loss of mismatch repair protein expression  Normal preoperative CEA  CT abdomen/pelvis 08/14/2018 with irregular wall thickening at the hepatic flexure and irregular wall thickening at the cecum, no evidence of metastatic disease  CT chest 09/03/2018-no evidence of metastatic disease  Patient declined adjuvant chemotherapy  CTs 11/19/2019-negative for recurrent disease  CEA elevated 05/26/2020  CTs 06/26/2020-multiple new and enlarging soft tissue nodules about the anastomotic site in the upper abdomen, anterior to the stomach and liver as well as within the mesocolon.  Cycle 1 FOLFOX 08/15/2020  Chemotherapy held 08/29/2020 due to mild neutropenia and poor performance status  2.Chronic renal failure 3.Anemia secondary to #1 and #2-ferritin 17 on 05/26/2020; transfused 2 units of blood 06/23/2020; stool positive for occult blood 06/28/2020  IV iron 06/28/2020 and 07/05/2020 4.Coronary artery disease 5.Multiple polyps-tubular adenomas noted on the 08/04/2018 colonoscopy and the 10/08/2018 right colectomy specimen  Colonoscopy 10/05/2019-tubular adenoma removed from the transverse colon  Bradley [name] is currently admitted due to small bowel obstruction and dehydration.  Abdominal distention has improved, but he continues to have persistent diarrhea.  No nausea or vomiting reported today.  Labs show hypokalemia, acute on chronic kidney disease, and worsening LFTs.  The patient's performance status has continued to decline and he is not a good candidate for any additional systemic chemotherapy.  Discussed hospice with the patient and his wife and they are agreeable to discharge home with hospice services in the home.  He is a DNR/DNI.  Recommendations: 1.  Bowel rest. 2.  Continue IV fluids. 3.  TOC consult placed for [facility] hospice.     LOS: 1 day   Mikey Bussing, DNP, AGPCNP-BC, AOCNP 09/07/20 Bradley [name] was admitted yesterday with dehydration and  failure to thrive.  He appears to have acute renal and liver injury.  He feels better today.  The abdomen is softer.  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  I discussed the current situation and treatment options with Bradley [name] and his wife.  He indicated he does not wish to receive further chemotherapy and agrees to hospice care.  He confirmed the no CODE BLUE status.  Ms. Mcpartlin would like to care for him in the home with hospice.  He does not appear to need an NG tube for decompression.  We will offer a follow-up visit at the Cancer center.

## 2020-09-07 NOTE — Progress Notes (Signed)
   Referral received from Alinda Sierras with transitions of care dept. Spoke to the wife Bradley [name] and the pt are both in agreement with hospice care at home. I have ordered a wheelchair and BSC to be delivered tomorrow at wife's request. Our MD has approved the pt for hospice care as well. Offer to see pt today after d/c from hospital but wife prefers tomorrow morning. We will see them at 1000am tomorrow morning for initial visit and enrollment into hospice services at home.   Webb Silversmith RN [phone]

## 2020-09-07 NOTE — TOC Initial Note (Signed)
Transition of Care [facility]) - Initial/Assessment Note    Patient Details  Name: Bradley [name] MRN: [id number] Date of Birth: Jan 28, 1931  Transition of Care [facility]) CM/SW Contact:    Lynnell Catalan, RN Phone Number: 09/07/2020, 2:37 PM  Clinical Narrative:                 MD asked this CM to speak with pt/wife for home hospice choice. Spoke with pt and wife at bedside who choose [place]. Cheri Liaison contacted for referral. Wife requesting [facility] and Wheelchair for home. Message relayed to [facility].  Expected Discharge Plan: Home w Hospice Care Barriers to Discharge: No Barriers Identified   Patient Goals and CMS Choice Patient states their goals for this hospitalization and ongoing recovery are:: To go home CMS Medicare.gov Compare Post Acute Care list provided to:: Patient Choice offered to / list presented to : Patient  Expected Discharge Plan and Services Expected Discharge Plan: [place] Acute Care Choice: Hospice Living arrangements for the past 2 months: Single Family Home Expected Discharge Date: 09/07/20                         HH Arranged: Disease Management [place] Agency: [facility] Date [facility] Agency Contacted: 09/07/20 Time [place]: 1200 Representative spoke with at [place]: [place] Arrangements/Services Living arrangements for the past 2 months: [place] with:: Spouse Patient language and need for interpreter reviewed:: Yes Do you feel safe going back to the place where you live?: Yes      Need for Family Participation in Patient Care: Yes (Comment) Care giver support system in place?: Yes (comment)   Criminal Activity/Legal Involvement Pertinent to Current Situation/Hospitalization: No - Comment as needed  Activities of Daily Living Home Assistive Devices/Equipment: Gilford Rile (specify type) (4 wheel walker) ADL Screening (condition at time of admission) Patient's cognitive  ability adequate to safely complete daily activities?: Yes Is the patient deaf or have difficulty hearing?: Yes Does the patient have difficulty seeing, even when wearing glasses/contacts?: No Does the patient have difficulty concentrating, remembering, or making decisions?: No Patient able to express need for assistance with ADLs?: Yes Does the patient have difficulty dressing or bathing?: No Independently performs ADLs?: Yes (appropriate for developmental age) Does the patient have difficulty walking or climbing stairs?: Yes Weakness of Legs: Both Weakness of Arms/Hands: None  Permission Sought/Granted Permission sought to share information with : Chartered certified accountant granted to share information with : Yes, Verbal Permission Granted     Permission granted to share info w AGENCY: [facility]        Emotional Assessment Appearance:: Appears stated age Attitude/Demeanor/Rapport: Gracious Affect (typically observed): Calm Orientation: : Oriented to Self Alcohol / Substance Use: Not Applicable    Admission diagnosis:  Abdominal pain [R10.9] Patient Active Problem List   Diagnosis Date Noted  . SBO (small bowel obstruction) ([place]) 09/07/2020  . Abdominal pain 09/06/2020  . Malignant neoplasm of ascending colon ([place]) 08/02/2020  . Goals of care, counseling/discussion 08/02/2020  . Genetic testing 05/20/2019  . Family history of breast cancer   . Family history of pancreatic cancer   . Family history of esophageal cancer   . History of colon polyps   . Coronary artery disease involving native coronary artery of native heart without angina pectoris 02/02/2019  . Sinus bradycardia 02/02/2019  . Colon cancer (  [place]) 10/08/2018  . Colon polyps 03/02/2004   PCP:  Jackquline Berlin, DO Pharmacy:   [facility], [place] - [address] [place] [address] Phone: [phone] Fax:  [phone]     Social Determinants of Health (SDOH) Interventions    Readmission Risk Interventions No flowsheet data found.

## 2020-09-07 NOTE — Progress Notes (Signed)
NP to bedside to further discuss SBO and NG tube placement with pt. Pt verbalized wanting to involve wife and MDs in his care and decision making. He would like to hold off on NG placement for now, NP aware. Pt is not nauseous or vomiting, not complaining of abd pain, but does have some distention. NP asked to be notified of any increased n/v, pain, or further distention. Continue to monitor. Hortencia Conradi RN

## 2020-09-07 NOTE — Progress Notes (Signed)
Pt has had 3 moderate liquid BMs overnight. Sample sent for c.diff testing and GI panel per order. Continues to deny nausea or significant pain but does take note of the tightness in his abdomen. Continue to monitor. Hortencia Conradi RN

## 2020-09-07 NOTE — Discharge Instructions (Signed)
Bowel Obstruction A bowel obstruction is a blockage in the small or large bowel. The bowel, which is also called the intestine, is a long, slender tube that connects the stomach to the anus. When a person eats and drinks, food and fluids go from the mouth to the stomach to the small bowel. This is where most of the nutrients in the food and fluids are absorbed. After the small bowel, material passes through the large bowel for further absorption until any leftover material leaves the body as stool through the anus during a bowel movement. A bowel obstruction will prevent food and fluids from passing through the bowel as they normally do during digestion. The bowel can become partially or completely blocked. If this condition is not treated, it can be dangerous because the bowel could rupture. What are the causes? Common causes of this condition include:  Scar tissue (adhesions) from previous surgery or treatment with high-energy X-rays (radiation).  Recent surgery. This may cause the movements of the bowel to slow down and cause food to block the intestine.  Inflammatory bowel disease, such as Crohn's disease or diverticulitis.  Growths or tumors.  A bulging organ (hernia).  Twisting of the bowel (volvulus).  A foreign body.  Slipping of a part of the bowel into another part (intussusception). What are the signs or symptoms? Symptoms of this condition include:  Pain in the abdomen. Depending on the degree of obstruction, pain may be: ? Mild or severe. ? Dull cramping or sharp pain. ? In one area or in the entire abdomen.  Nausea and vomiting. Vomit may be greenish or a yellow bile color.  Bloating in the abdomen.  Difficulty passing stool (constipation).  Lack of passing gas.  Frequent belching.  Diarrhea. This may occur if the obstruction is partial and runny stool is able to leak around the obstruction. How is this diagnosed? This condition may be diagnosed based on:  A  physical exam.  Medical history.  Imaging tests of the abdomen or pelvis, such as X-ray or CT scan.  Blood or urine tests. How is this treated? Treatment for this condition depends on the cause and severity of the problem. Treatment may include:  Fluids and pain medicines that are given through an IV. Your health care provider may instruct you not to eat or drink if you have nausea or vomiting.  Eating a simple diet. You may be asked to consume a clear liquid diet for several days. This allows the bowel to rest.  Placement of a small tube (nasogastric tube) into the stomach. This will relieve pain, discomfort, and nausea by removing blocked air and fluids from the stomach. It can also help the obstruction clear up faster.  Surgery. This may be required if other treatments do not work. Surgery may be required for: ? Bowel obstruction from a hernia. This can be an emergency procedure. ? Scar tissue that causes frequent or severe obstructions. Follow these instructions at home: Medicines  Take over-the-counter and prescription medicines only as told by your health care provider.  If you were prescribed an antibiotic medicine, take it as told by your health care provider. Do not stop taking the antibiotic even if you start to feel better. General instructions  Follow instructions from your health care provider about eating restrictions. You may need to avoid solid foods and consume only clear liquids until your condition improves.  Return to your normal activities as told by your health care provider. Ask your health care   provider what activities are safe for you.  Avoid sitting for a long time without moving. Get up to take short walks every 1-2 hours. This is important to improve blood flow and breathing. Ask for help if you feel weak or unsteady.  Keep all follow-up visits as told by your health care provider. This is important. How is this prevented? After having a bowel  obstruction, you are more likely to have another. You may do the following things to prevent another obstruction:  If you have a long-term (chronic) disease, pay attention to your symptoms and contact your health care provider if you have questions or concerns.  Avoid becoming constipated. To prevent or treat constipation, your health care provider may recommend that you: ? Drink enough fluid to keep your urine pale yellow. ? Take over-the-counter or prescription medicines. ? Eat foods that are high in fiber, such as beans, whole grains, and fresh fruits and vegetables. ? Limit foods that are high in fat and processed sugars, such as fried or sweet foods.  Stay active. Exercise for 30 minutes or more, 5 or more days each week. Ask your health care provider which exercises are safe for you.  Avoid stress. Find ways to reduce stress, such as meditation, exercise, or taking time for activities that relax you.  Instead of eating three large meals each day, eat three small meals with three small snacks.  Work with a dietitian to make a healthy meal plan that works for you.  Do not use any products that contain nicotine or tobacco, such as cigarettes and e-cigarettes. If you need help quitting, ask your health care provider. Contact a health care provider if you:  Have a fever.  Have chills. Get help right away if you:  Have increased pain or cramping.  Vomit blood.  Have uncontrolled vomiting or nausea.  Cannot drink fluids because of vomiting or pain.  Become confused.  Begin feeling very thirsty (dehydrated).  Have severe bloating.  Feel extremely weak or you faint. Summary  A bowel obstruction is a blockage in the small or large bowel.  A bowel obstruction will prevent food and fluids from passing through the bowel as they normally do during digestion.  Treatment for this condition depends on the cause and severity of the problem. It may include fluids and pain medicines  through an IV, a simple diet, a nasogastric tube, or surgery.  Follow instructions from your health care provider about eating restrictions. You may need to avoid solid foods and consume only clear liquids until your condition improves. This information is not intended to replace advice given to you by your health care provider. Make sure you discuss any questions you have with your health care provider. Document Revised: 09/25/2018 Document Reviewed: 12/31/2017 Elsevier Patient Education  2020 [employer]  

## 2020-09-07 NOTE — Discharge Summary (Signed)
Physician Discharge Summary  Bradley [name]:[id number] DOB: Dec 16, 1930 DOA: 09/06/2020  PCP: Jackquline Berlin, DO  Admit date: 09/06/2020 Discharge date: 09/07/2020  Admitted From: Home Disposition: Home with Hospice   Discharge Condition: Hospice CODE STATUS: DNR Diet recommendation: As tolerated.   Brief/Interim Summary:  Admission HPI written by [name], DO   Chief Complaint: abdominal distention and pain.   HPI: Bradley [name] is a 87 y.o. male with medical history significant of colon cancer. History is from wife as patient is very hard of hearing. She reports that he has cycled back and forth between diarrhea and constipation over the last couple of weeks. When he has constipation, his stomach get distended and he is unable to pass stool or gas. This will last for a day then switch to diarrhea. He has had N/V and poor PO intake over these past couple of weeks. She reported his symptoms to his PCP. It was recommended that he try senokot and milk of magnesia. When tried, the constipation would resolve, but the diarrhea would begin again. He was evaluated by his Oncologist today; who found him significantly dehydrated and recommended that he come to the hospital for assistance.     Hospital course:  Small bowel obstruction In setting of colon cancer. No CT imaging to confirm etiology as patient decided to transition to hospice care. Patient set up with [facility] and discharged home.  Diarrhea Recommended against imodium to minimize worsening obstruction symptoms. Management by hospice. Discussed with patient.  Hypertension Patient is on atenolol. Continues this to minimize rebound tachycardia but can be discontinued as warranted.  Discharge Diagnoses:  Principal Problem:   SBO (small bowel obstruction) (Seltzer) Active Problems:   Abdominal pain    Discharge Instructions   Allergies as of 09/07/2020   No Known Allergies     Medication  List    STOP taking these medications   aspirin EC 81 MG tablet   atorvastatin 40 MG tablet Commonly known as: LIPITOR   Lutein 40 MG Caps   ondansetron 8 MG tablet Commonly known as: ZOFRAN   PRESERVISION AREDS PO   prochlorperazine 5 MG tablet Commonly known as: COMPAZINE   traMADol 50 MG tablet Commonly known as: ULTRAM     TAKE these medications   atenolol 25 MG tablet Commonly known as: TENORMIN Take 25 mg by mouth at bedtime.   lidocaine-prilocaine cream Commonly known as: EMLA Apply 1 application topically as directed. What changed:   when to take this  reasons to take this   morphine 20 MG/ML concentrated solution Commonly known as: ROXANOL Take 0.25-0.5 mLs (5-10 mg total) by mouth every 4 (four) hours as needed for moderate pain, severe pain or shortness of breath. May give sublingually if needed.   ondansetron 8 MG disintegrating tablet Commonly known as: Zofran ODT Take 1 tablet (8 mg total) by mouth every 8 (eight) hours as needed for nausea or vomiting.   prochlorperazine 25 MG suppository Commonly known as: COMPAZINE Place 1 suppository (25 mg total) rectally every 4 (four) hours as needed for refractory nausea / vomiting.       No Known Allergies  Consultations:  Medical oncology   Procedures/Studies: DG Abd 1 View  Result Date: 09/06/2020 CLINICAL DATA:  History of colon cancer with dehydration and a mixture of constipation and diarrhea. EXAM: ABDOMEN - 1 VIEW COMPARISON:  None. FINDINGS: Multiple dilated small bowel loops are seen throughout the abdomen. There is  no evidence of free air. Radiopaque surgical clips are seen overlying the right upper quadrant. No radio-opaque calculi or other significant radiographic abnormality are seen. IMPRESSION: Findings consistent with small bowel obstruction. Electronically Signed   By: Virgina Norfolk M.D.   On: 09/06/2020 20:22   US RENAL  Result Date: 09/06/2020 CLINICAL DATA:  Acute renal  insufficiency EXAM: RENAL / URINARY TRACT ULTRASOUND COMPLETE COMPARISON:  11/19/2019, 06/26/2020 FINDINGS: Right Kidney: Renal measurements: 7.9 x 4.3 by 3.0 cm = volume: 53.7 mL. The kidneys are atrophic, with increased renal cortical echotexture and loss of corticomedullary differentiation. Simple cyst measuring 2.6 x 1.7 by 2.3 cm within the lateral mid aspect. No hydronephrosis. Left Kidney: Renal measurements: 7.8 x 4.4 by 5.8 cm = volume: 103.8 mL. The kidneys are atrophic, with increased renal cortical echotexture and loss of corticomedullary differentiation. 2.7 x 2.3 by 3.0 cm simple cyst ventral mid aspect. No hydronephrosis. Bladder: Bladder is minimally distended with no gross abnormalities. Other: Enlarged prostate results in mass effect upon the posterior wall the bladder, grossly stable since prior CT. IMPRESSION: 1. Bilateral renal cortical atrophy with increased cortical echotexture compatible with medical renal disease. 2. Bilateral simple renal cysts. 3. Enlarged prostate. Electronically Signed   By: Randa Ngo M.D.   On: 09/06/2020 19:35   DG CHEST PORT 1 VIEW  Result Date: 08/09/2020 CLINICAL DATA:  Port-A-Cath placement today. EXAM: PORTABLE CHEST 1 VIEW COMPARISON:  Chest CT 06/26/2020.  No comparison radiographs. FINDINGS: 1446 hours. Two views obtained. There is a right IJ Port-A-Cath, tip terminating at the lower SVC level. The heart size and mediastinal contours appear stable. There is aortic atherosclerosis. The lungs are clear. No pleural effusion or pneumothorax. The bones appear unremarkable. Bilateral carotid atherosclerosis and cholecystectomy clips are noted. IMPRESSION: Satisfactorily positioned right IJ Port-A-Cath. No pneumothorax or acute findings. Electronically Signed   By: Richardean Sale M.D.   On: 08/09/2020 15:22   DG C-Arm 1-60 Min-No Report  Result Date: 08/09/2020 Fluoroscopy was utilized by the requesting physician.  No radiographic interpretation.        Subjective: No abdominal pain, nausea or vomiting.  Discharge Exam: Vitals:   09/07/20 0011 09/07/20 0433  BP: (!) 150/47 (!) 124/49  Pulse: 76 83  Resp: 14 16  Temp: 97.7 F (36.5 C) 97.7 F (36.5 C)  SpO2: 98% 98%   Vitals:   09/06/20 1607 09/06/20 2013 09/07/20 0011 09/07/20 0433  BP: 131/61 140/65 (!) 150/47 (!) 124/49  Pulse: 63 66 76 83  Resp: 16 14 14 16   Temp: 97.6 F (36.4 C) 97.7 F (36.5 C) 97.7 F (36.5 C) 97.7 F (36.5 C)  TempSrc: Oral Oral Oral Oral  SpO2: 98% 99% 98% 98%    General: Pt is alert, awake, not in acute distress Cardiovascular: RRR, S1/S2 +, no rubs, no gallops Respiratory: CTA bilaterally, no wheezing, no rhonchi Abdominal: Soft, NT, distended, bowel sounds + Extremities: no edema, no cyanosis    The results of significant diagnostics from this hospitalization (including imaging, microbiology, ancillary and laboratory) are listed below for reference.     Microbiology: Recent Results (from the past 240 hour(s))  Culture, blood (routine x 2)     Status: None (Preliminary result)   Collection Time: 09/06/20  6:52 PM   Specimen: BLOOD  Result Value Ref Range Status   Specimen Description   Final    BLOOD RIGHT ANTECUBITAL Performed at [facility] [address] [place],  [address]  Special Requests   Final    BOTTLES DRAWN AEROBIC AND ANAEROBIC Blood Culture adequate volume Performed at [facility] [address] [place], [place] [address]    Culture   Final    NO GROWTH < 12 HOURS Performed at [facility] [address] [place], [place] [address]    Report Status PENDING  Incomplete  Culture, blood (routine x 2)     Status: None (Preliminary result)   Collection Time: 09/06/20  6:52 PM   Specimen: BLOOD LEFT HAND  Result Value Ref Range Status   Specimen Description   Final    BLOOD LEFT HAND Performed at [facility] [address] [place], [place] [address]    Special Requests   Final    BOTTLES DRAWN AEROBIC ONLY Blood Culture adequate volume Performed at [facility] [address], [place], [place] [address]    Culture   Final    NO GROWTH < 12 HOURS Performed at [facility] [address], [place], [place] [address]    Report Status PENDING  Incomplete  C Difficile Quick Screen (NO PCR Reflex)     Status: None   Collection Time: 09/07/20  2:00 AM   Specimen: STOOL  Result Value Ref Range Status   C Diff antigen NEGATIVE NEGATIVE Final   C Diff toxin NEGATIVE NEGATIVE Final   C Diff interpretation No C. difficile detected.  Final    Comment: Performed at [facility], [place] [address], [place], [place] [address]     Labs: BNP (last 3 results) No results for input(s): BNP in the last 8760 hours. Basic Metabolic Panel: Recent Labs  Lab 09/06/20 1205 09/06/20 2111 09/07/20 0434  NA 135  --  135  K 3.7  --  3.1*  CL 100  --  103  CO2 16*  --  13*  GLUCOSE 131*  --  96  BUN 87*  --  92*  CREATININE 3.96*  --  3.61*  CALCIUM 8.2*  --  7.5*  MG  --  2.5*  --   PHOS  --  7.2*  --    Liver Function Tests: Recent Labs  Lab 09/06/20 1205 09/07/20 0434  AST 51* 773*  ALT 67* 379*  ALKPHOS 69 138*  BILITOT 0.6 1.6*  PROT 6.5 5.6*  ALBUMIN 3.1* 2.8*   No results for input(s): LIPASE, AMYLASE in the last 168 hours. No results for input(s): AMMONIA in the last 168 hours. CBC: Recent Labs  Lab 09/06/20 1205 09/07/20 0434  WBC 18.3* 36.5*  NEUTROABS 14.8*  --   HGB 11.7* 10.9*  HCT 35.9* 33.3*  MCV 85.1 87.2  PLT 400 309   Cardiac Enzymes: No results for input(s): CKTOTAL, CKMB, CKMBINDEX, TROPONINI in the last 168 hours. BNP: Invalid input(s): POCBNP CBG: No results for input(s): GLUCAP in the last 168 hours. D-Dimer No results for input(s): DDIMER in the last 72 hours. Hgb A1c No results for input(s): HGBA1C in the last 72 hours. Lipid  Profile No results for input(s): CHOL, HDL, LDLCALC, TRIG, CHOLHDL, LDLDIRECT in the last 72 hours. Thyroid function studies No results for input(s): TSH, T4TOTAL, T3FREE, THYROIDAB in the last 72 hours.  Invalid input(s): FREET3 Anemia work up No results for input(s): VITAMINB12, FOLATE, FERRITIN, TIBC, IRON, RETICCTPCT in the last 72 hours. Urinalysis    Component Value Date/Time   COLORURINE YELLOW 09/06/2020 1817   APPEARANCEUR HAZY (  A) 09/06/2020 1817   LABSPEC 1.014 09/06/2020 1817   PHURINE 5.0 09/06/2020 1817   GLUCOSEU NEGATIVE 09/06/2020 1817   HGBUR NEGATIVE 09/06/2020 1817   BILIRUBINUR NEGATIVE 09/06/2020 1817   KETONESUR 5 (A) 09/06/2020 1817   PROTEINUR NEGATIVE 09/06/2020 1817   NITRITE NEGATIVE 09/06/2020 1817   LEUKOCYTESUR NEGATIVE 09/06/2020 1817   Sepsis Labs Invalid input(s): PROCALCITONIN,  WBC,  LACTICIDVEN Microbiology Recent Results (from the past 240 hour(s))  Culture, blood (routine x 2)     Status: None (Preliminary result)   Collection Time: 09/06/20  6:52 PM   Specimen: BLOOD  Result Value Ref Range Status   Specimen Description   Final    BLOOD RIGHT ANTECUBITAL Performed at [facility], [place] [address] [place], [place] [address]    Special Requests   Final    BOTTLES DRAWN AEROBIC AND ANAEROBIC Blood Culture adequate volume Performed at [facility] [address] [place], [place] [address]    Culture   Final    NO GROWTH < 12 HOURS Performed at [facility] [address] [place], [place] [address]    Report Status PENDING  Incomplete  Culture, blood (routine x 2)     Status: None (Preliminary result)   Collection Time: 09/06/20  6:52 PM   Specimen: BLOOD LEFT HAND  Result Value Ref Range Status   Specimen Description   Final    BLOOD LEFT HAND Performed at [facility] [address] [place], [place] [address]    Special Requests   Final    BOTTLES DRAWN  AEROBIC ONLY Blood Culture adequate volume Performed at [facility] [address] [place], [place] [address]    Culture   Final    NO GROWTH < 12 HOURS Performed at [facility] [address], [place], [place] [address]    Report Status PENDING  Incomplete  C Difficile Quick Screen (NO PCR Reflex)     Status: None   Collection Time: 09/07/20  2:00 AM   Specimen: STOOL  Result Value Ref Range Status   C Diff antigen NEGATIVE NEGATIVE Final   C Diff toxin NEGATIVE NEGATIVE Final   C Diff interpretation No C. difficile detected.  Final    Comment: Performed at [facility], [place] [address], [place], [place] [address]    SIGNED:   Cordelia Poche, MD [facility] 09/07/2020, 1:45 PM

## 2020-09-08 ENCOUNTER — Ambulatory Visit: Payer: Medicare Other | Admitting: Nurse Practitioner

## 2020-09-08 ENCOUNTER — Telehealth: Payer: Self-pay | Admitting: *Deleted

## 2020-09-08 ENCOUNTER — Other Ambulatory Visit: Payer: Medicare Other

## 2020-09-08 NOTE — Progress Notes (Signed)
Bradley [name] was seen for COVID-19 testing prior to admission. A COVID-19's specimen was collected and was submitted for testing.  Sandi Mealy, MHS, PA-C Physician Assistant

## 2020-09-08 NOTE — Telephone Encounter (Signed)
Called wife to f/u on status since discharge. He is still very weak, but much less diarrhea and is starting to eat again. Ate spaghetti last night. He is comfortable. Hospice has arranged for [facility] and wheelchair. Appreciates care provided by staff and she will call when/if he wants to come into office and be seen.

## 2020-09-09 ENCOUNTER — Ambulatory Visit: Payer: Medicare Other

## 2020-09-11 LAB — CULTURE, BLOOD (ROUTINE X 2)
Culture: NO GROWTH
Culture: NO GROWTH
Special Requests: ADEQUATE
Special Requests: ADEQUATE

## 2020-09-12 ENCOUNTER — Ambulatory Visit: Payer: Medicare Other

## 2020-09-12 ENCOUNTER — Ambulatory Visit: Payer: Medicare Other | Admitting: Oncology

## 2020-09-12 ENCOUNTER — Other Ambulatory Visit: Payer: Medicare Other

## 2020-10-03 DEATH — deceased

## 2021-12-26 IMAGING — CT CT CHEST W/O CM
1 of 2 series · 12 of 32 positions shown, 18 images · non-contrast
Comparison: CT chest, 09/03/2018, CT abdomen pelvis, 08/14/2018

CLINICAL DATA: Follow-up staging for colon cancer, status post
colectomy

EXAM:
CT CHEST, ABDOMEN AND PELVIS WITHOUT CONTRAST
TECHNIQUE: Multidetector CT imaging of the chest, abdomen and pelvis was
performed following the standard protocol without IV contrast. Oral
enteric contrast was administered.

[Series 2: chest/abd/pelvis w/(date) · axial · 0.80mm/px · z∈[-627,-62]mm · 12 of 135 slices shown, 18 images]
[im 11/135  soft-tissue]
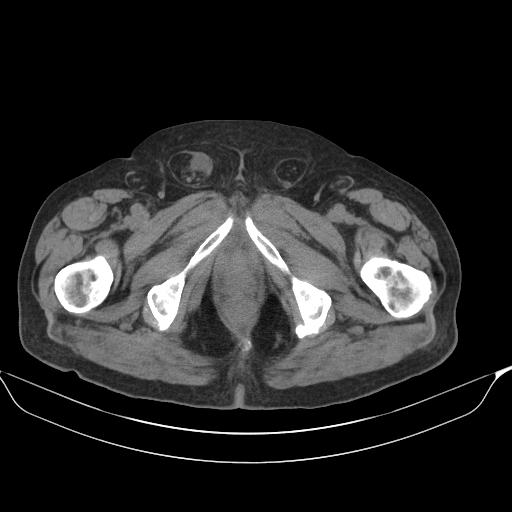
[im 11/135  bone]
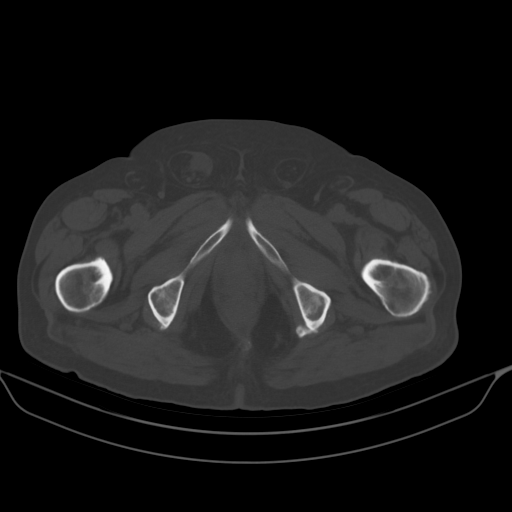
[im 21/135  soft-tissue]
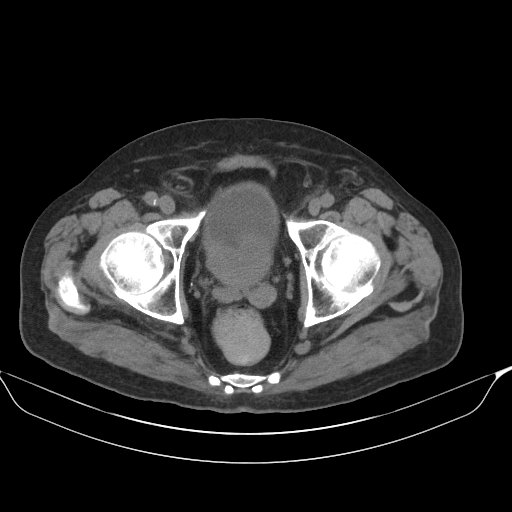
[im 31/135  soft-tissue]
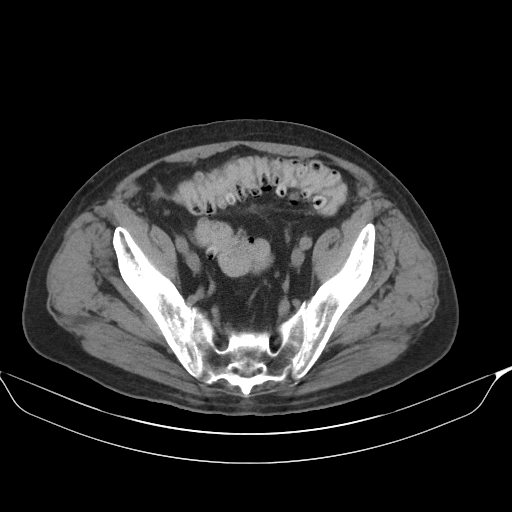
[im 42/135  soft-tissue]
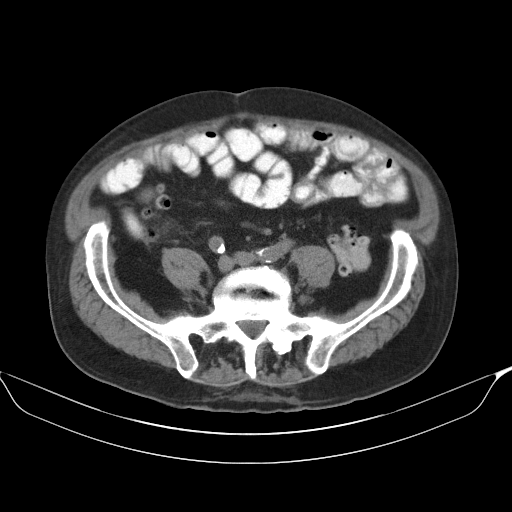
[im 52/135  soft-tissue]
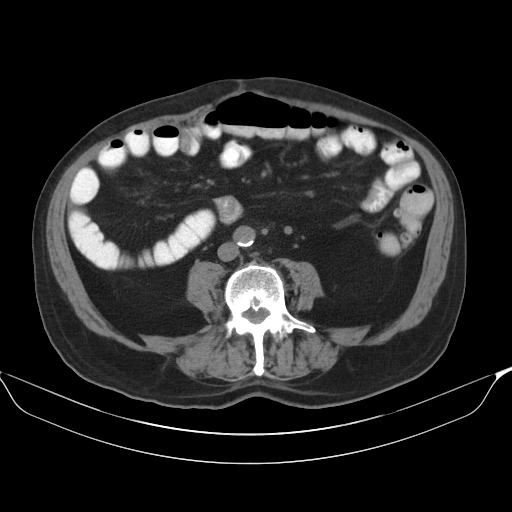
[im 62/135  soft-tissue]
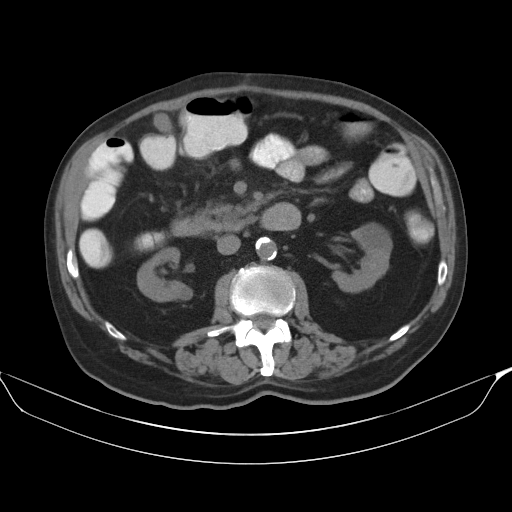
[im 73/135  soft-tissue]
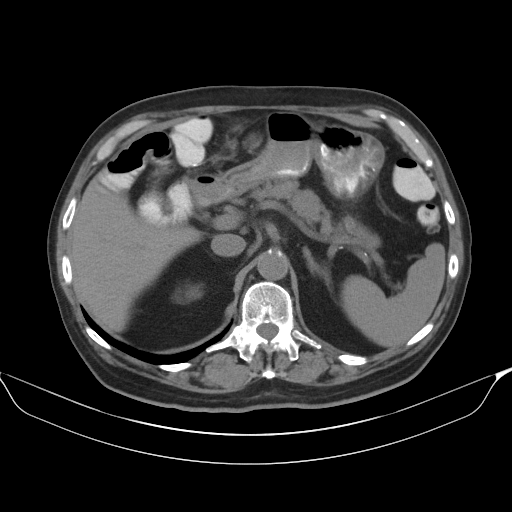
[im 83/135  soft-tissue]
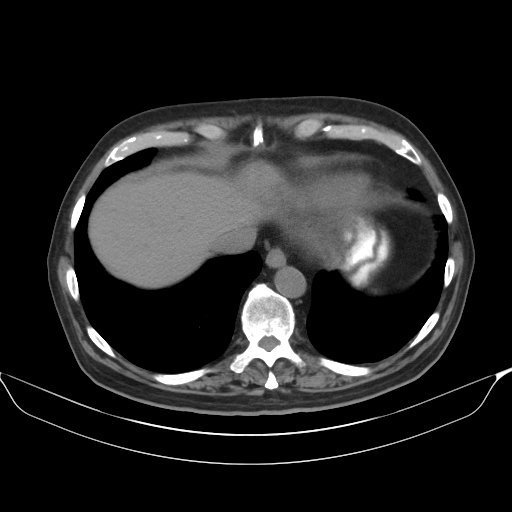
[im 93/135  soft-tissue]
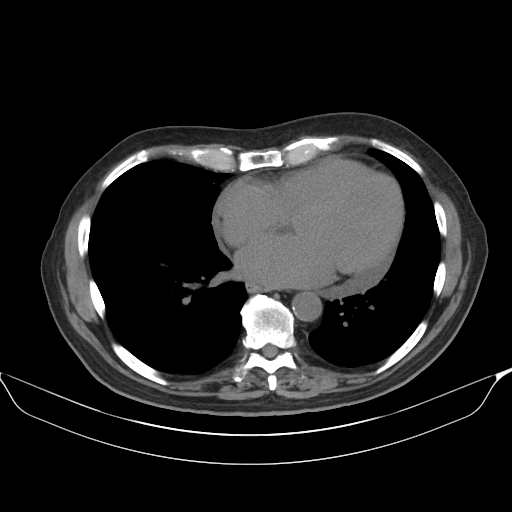
[im 93/135  lung]
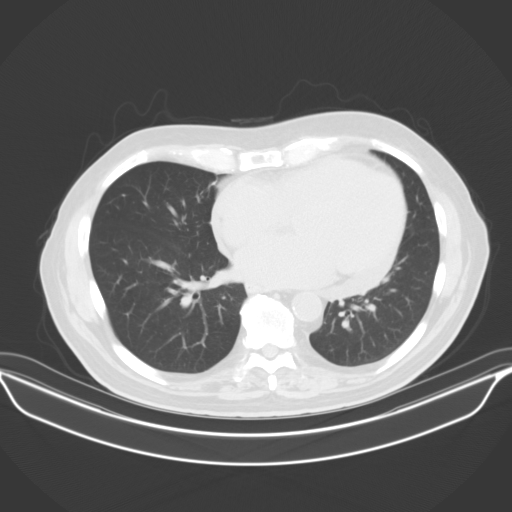
[im 93/135  bone]
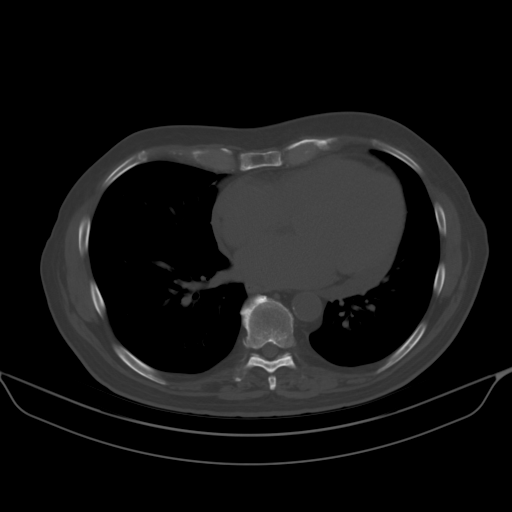
[im 104/135  soft-tissue]
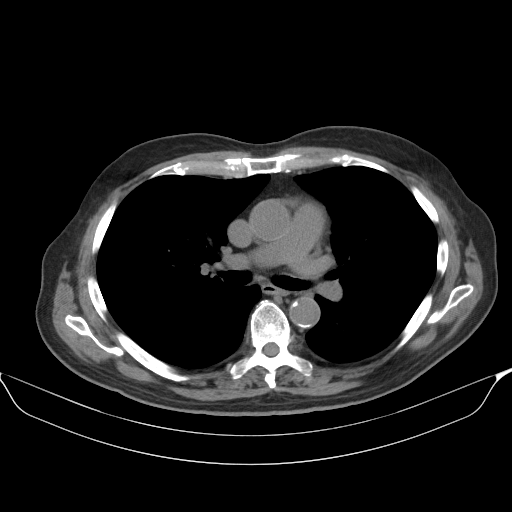
[im 104/135  lung]
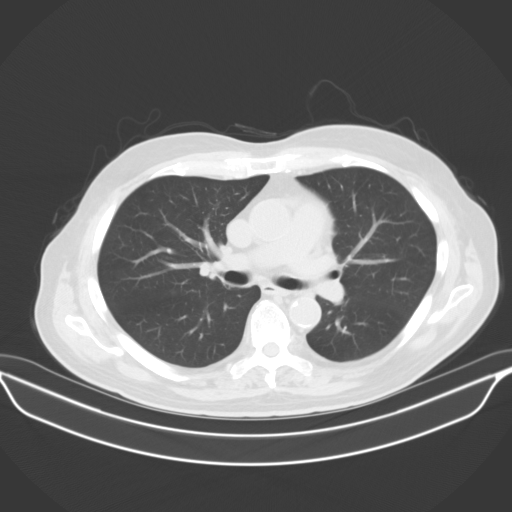
[im 114/135  soft-tissue]
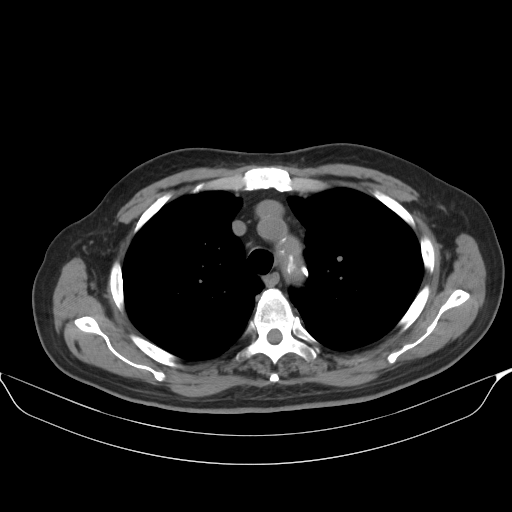
[im 114/135  lung]
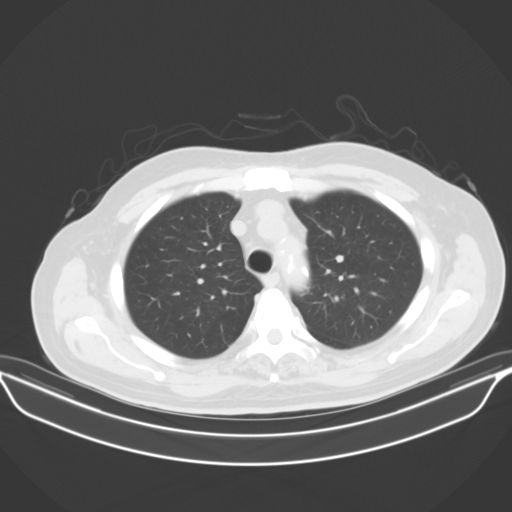
[im 124/135  soft-tissue]
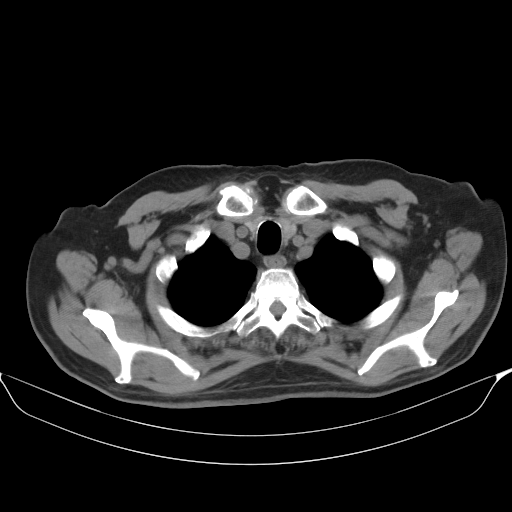
[im 124/135  lung]
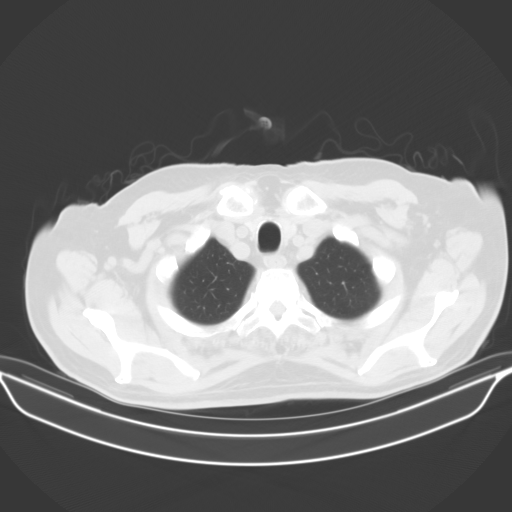

[12 of 32 positions shown; findings below may reference images not displayed]

FINDINGS: CT CHEST FINDINGS

Cardiovascular: Aortic atherosclerosis. Mild cardiomegaly.
Three-vessel coronary artery calcifications. Trace pericardial
effusion.

Mediastinum/Nodes: No enlarged mediastinal, hilar, or axillary lymph
nodes. Thyroid gland, trachea, and esophagus demonstrate no
significant findings.

Lungs/Pleura: Lungs are clear. No pleural effusion or pneumothorax.

Musculoskeletal: No chest wall mass or suspicious bone lesions
identified.

CT ABDOMEN PELVIS FINDINGS

Hepatobiliary: No focal liver abnormality is seen. Status post
cholecystectomy. No biliary dilatation.

Pancreas: Unremarkable. No pancreatic ductal dilatation or
surrounding inflammatory changes.

Spleen: Normal in size without significant abnormality.

Adrenals/Urinary Tract: Adrenal glands are unremarkable. Mildly
atrophic kidneys bilaterally. Multiple low-attenuation lesions of
the kidneys, the largest of which appear to be fluid attenuation
simple cysts, others incompletely characterized on noncontrast
examination. Bladder is unremarkable.

Stomach/Bowel: Stomach is within normal limits. Status post right
hemicolectomy. Large intraluminal lipoma of the distal small bowel
again noted (series 2, image 89). No evidence of bowel wall
thickening, distention, or inflammatory changes. Sigmoid
diverticulosis.

Vascular/Lymphatic: Aortic atherosclerosis. No enlarged abdominal or
pelvic lymph nodes.

Reproductive: Prostatomegaly.

Other: Fat containing bilateral inguinal hernias. No abdominopelvic
ascites.

Musculoskeletal: No acute or significant osseous findings.
IMPRESSION: 1. Status post right hemicolectomy. No noncontrast evidence of
metastatic disease in the chest, abdomen or pelvis.
2. Sigmoid diverticulosis.
3. Prostatomegaly.
4. Coronary artery disease.
5. Trace, nonspecific pericardial effusion, unchanged.
6. Aortic Atherosclerosis ([id number]).

## 2022-03-25 ENCOUNTER — Other Ambulatory Visit: Payer: Self-pay

## 2022-10-14 IMAGING — DX DG ABDOMEN 1V
2 series · 2 of 2 positions shown · non-contrast
Comparison: None.

CLINICAL DATA: History of colon cancer with dehydration and a
mixture of constipation and diarrhea.

EXAM:
ABDOMEN - 1 VIEW

[abdomen kub (1 of 2)]
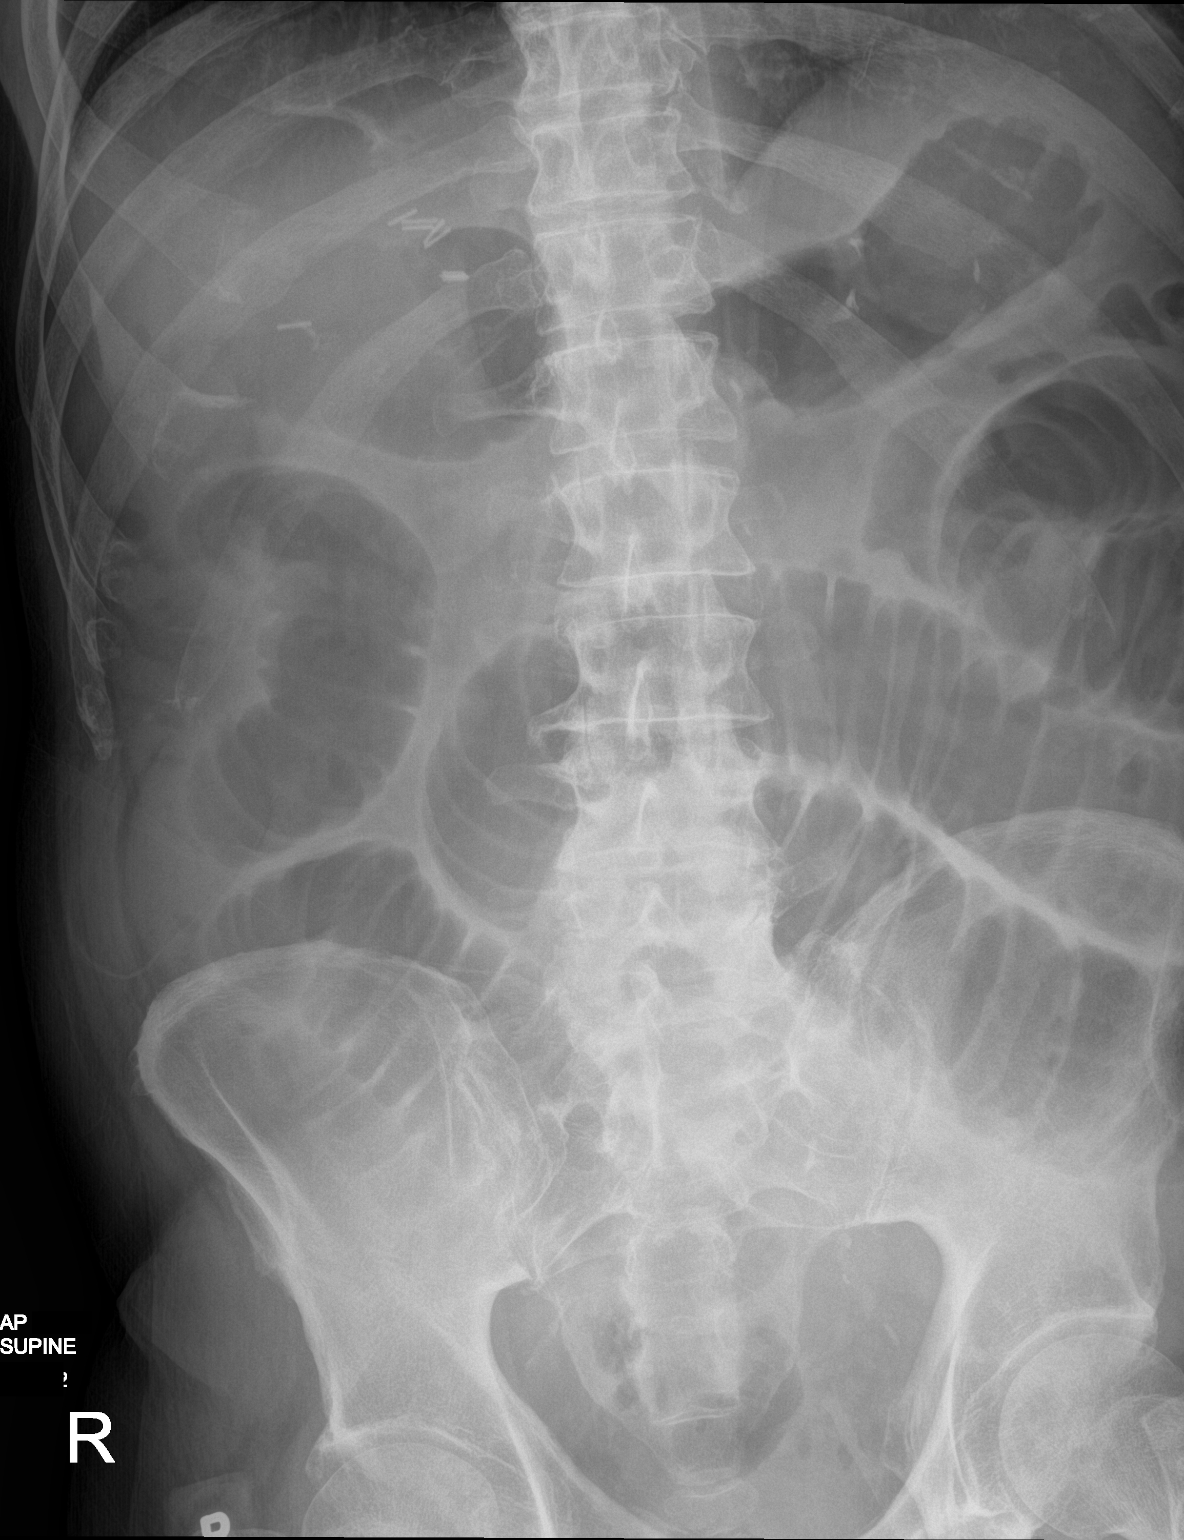

[abdomen kub (2 of 2)]
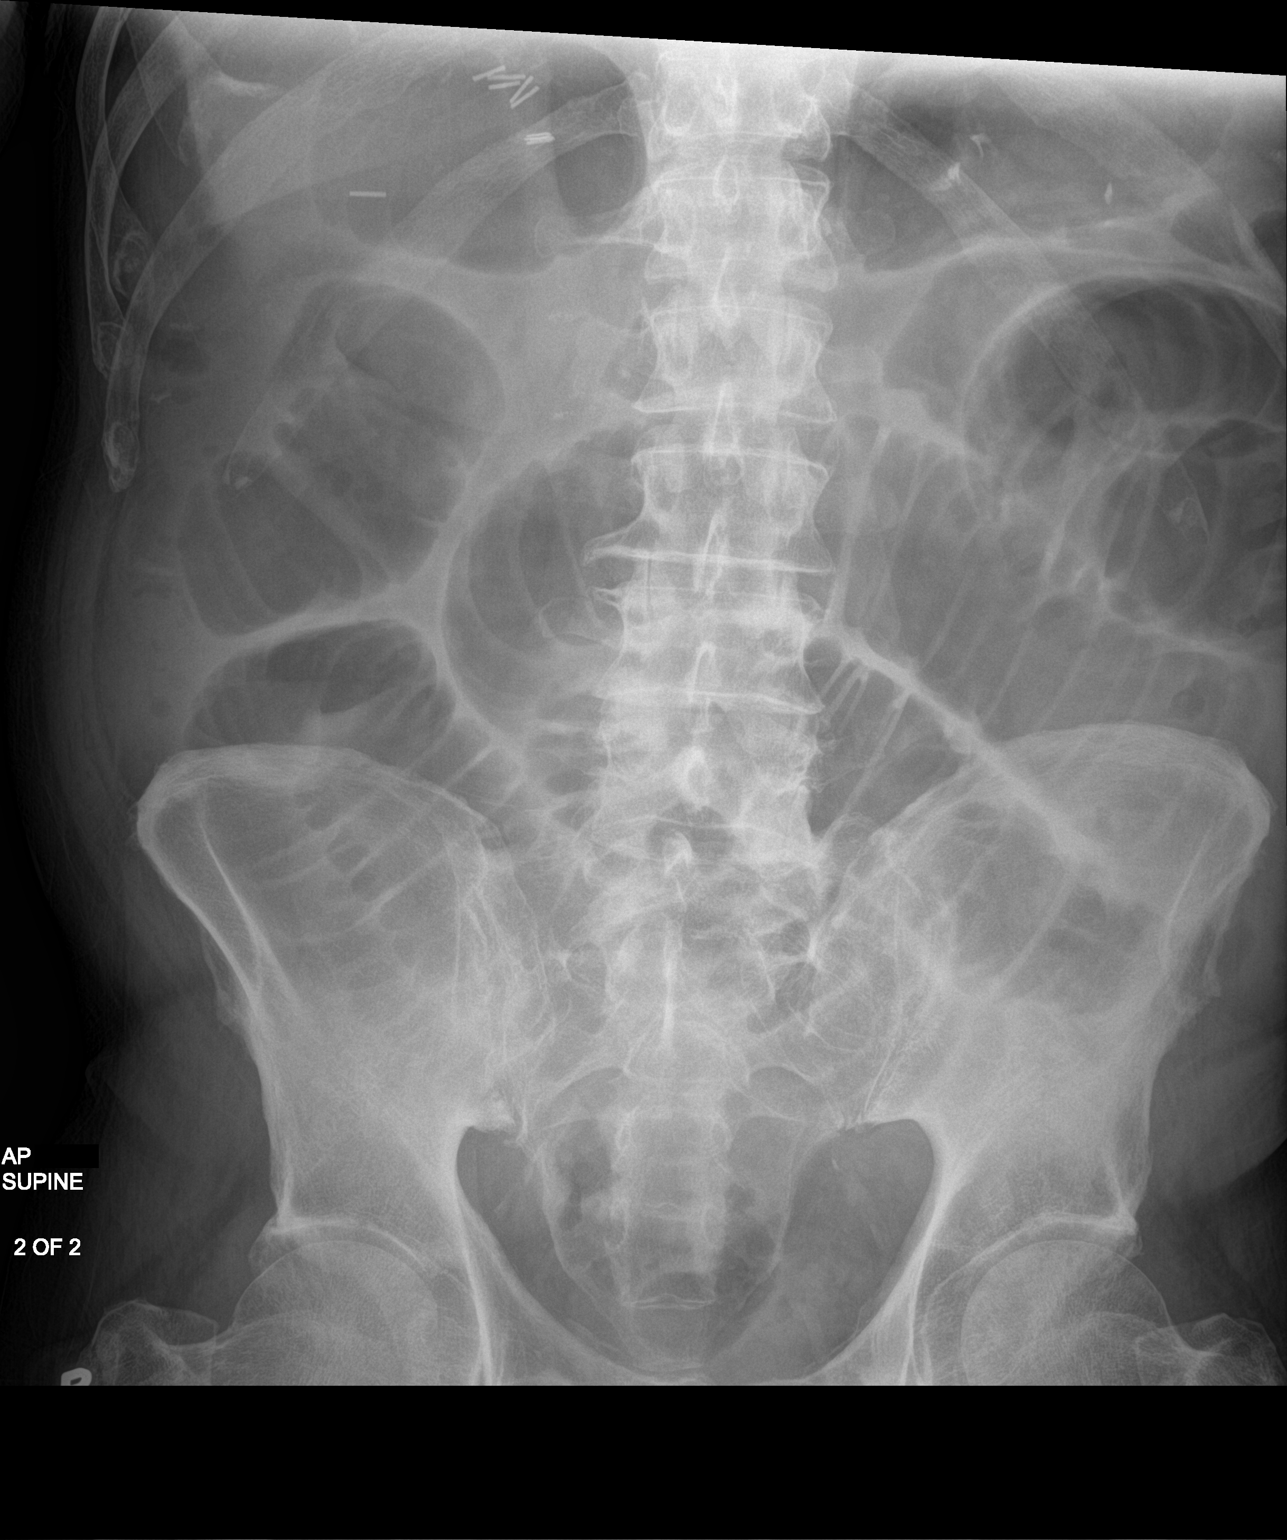

[2 of 2 positions shown; findings below may reference images not displayed]

FINDINGS: Multiple dilated small bowel loops are seen throughout the abdomen.
There is no evidence of free air. Radiopaque surgical clips are seen
overlying the right upper quadrant. No radio-opaque calculi or other
significant radiographic abnormality are seen.
IMPRESSION: Findings consistent with small bowel obstruction.
